# Patient Record
Sex: Male | Born: 1935
Health system: Southern US, Community
[De-identification: ages and names within clinical notes are randomized; demographics above are authoritative.]

## PROBLEM LIST (undated history)

## (undated) DIAGNOSIS — Z86010 Personal history of colonic polyps: Secondary | ICD-10-CM

## (undated) DIAGNOSIS — Z9089 Acquired absence of other organs: Secondary | ICD-10-CM

## (undated) DIAGNOSIS — Z9889 Other specified postprocedural states: Secondary | ICD-10-CM

## (undated) DIAGNOSIS — H269 Unspecified cataract: Secondary | ICD-10-CM

## (undated) HISTORY — DX: Other specified postprocedural states: Z98.890

## (undated) HISTORY — DX: Personal history of colonic polyps: Z86.010

## (undated) HISTORY — DX: Acquired absence of other organs: Z90.89

## (undated) HISTORY — DX: Unspecified cataract: H26.9

## (undated) HISTORY — PX: HERNIA REPAIR: SHX51

## (undated) HISTORY — PX: HEMORRHOID SURGERY: SHX153

---
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
## 1976-06-25 HISTORY — PX: REPAIR KNEE LIGAMENT: SUR1188

## 2005-05-14 ENCOUNTER — Emergency Department (HOSPITAL_COMMUNITY): Admission: EM | Admit: 2005-05-14 | Discharge: 2005-05-14 | Payer: Self-pay | Admitting: Emergency Medicine

## 2006-04-04 ENCOUNTER — Emergency Department (HOSPITAL_COMMUNITY): Admission: EM | Admit: 2006-04-04 | Discharge: 2006-04-04 | Payer: Self-pay | Admitting: Emergency Medicine

## 2006-05-30 ENCOUNTER — Ambulatory Visit (HOSPITAL_COMMUNITY): Admission: RE | Admit: 2006-05-30 | Discharge: 2006-05-30 | Payer: Self-pay | Admitting: Orthopedic Surgery

## 2006-06-25 HISTORY — PX: ROTATOR CUFF REPAIR: SHX139

## 2006-07-09 ENCOUNTER — Ambulatory Visit (HOSPITAL_COMMUNITY): Admission: RE | Admit: 2006-07-09 | Discharge: 2006-07-10 | Payer: Self-pay | Admitting: Orthopedic Surgery

## 2007-06-26 HISTORY — PX: CATARACT EXTRACTION, BILATERAL: SHX1313

## 2008-06-29 IMAGING — CR DG ORBITS FOR FOREIGN BODY
2 series · 2 of 2 positions shown · non-contrast
Comparison: CT 04/04/06.

CLINICAL DATA: Pre-MRI.  Prior metal exposure.
ORBITS ? 2 VIEWS:

[view not recorded (1 of 2)]
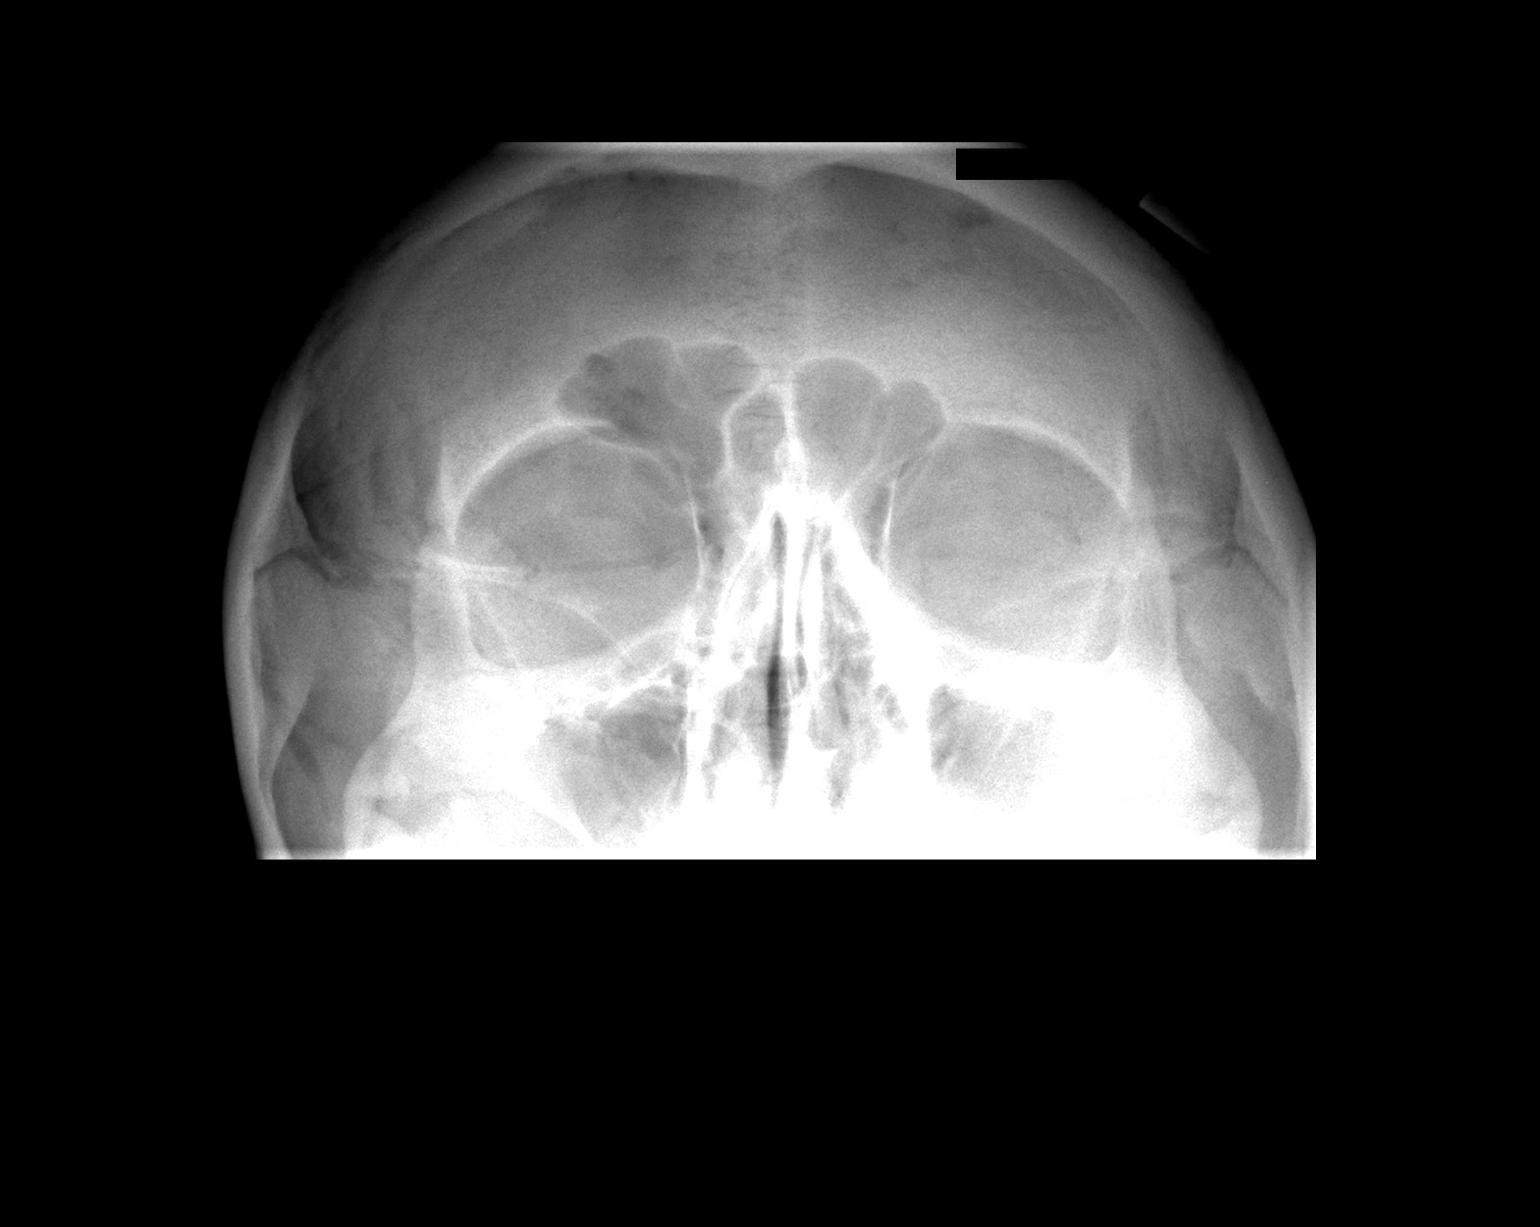

[view not recorded (2 of 2)]
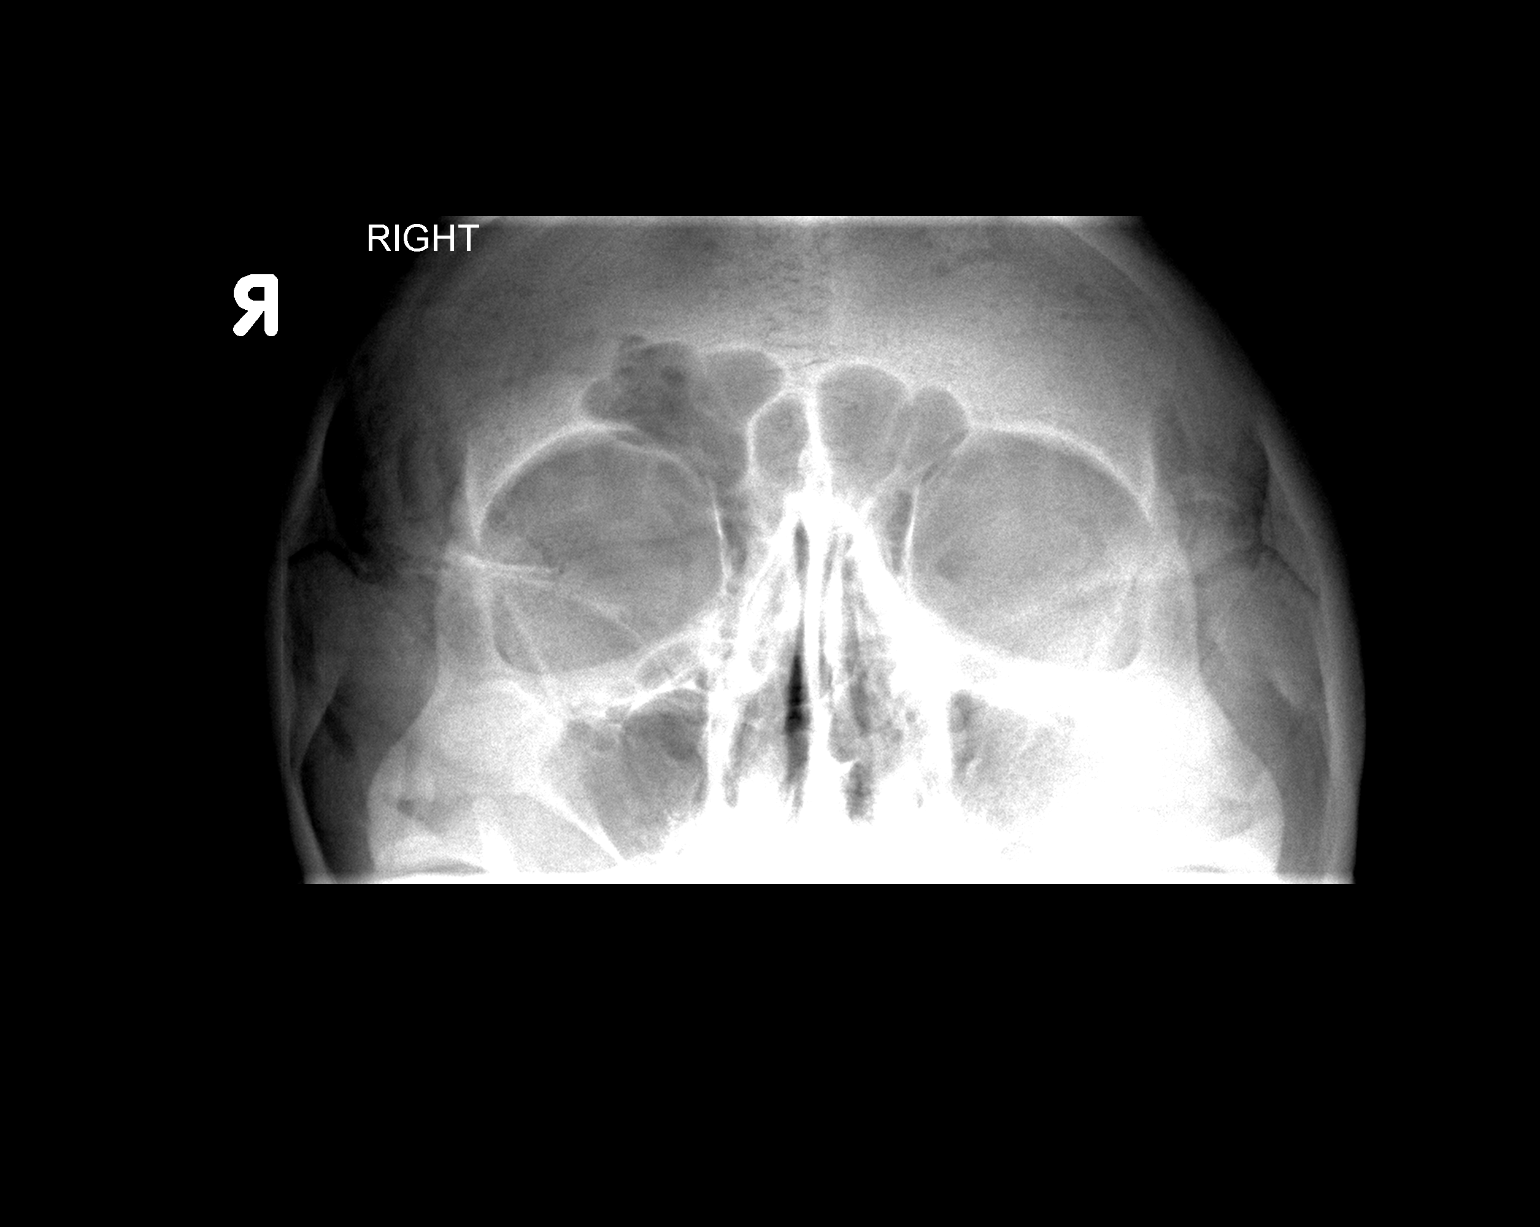

[2 of 2 positions shown; findings below may reference images not displayed]

FINDINGS: No metallic objects of the orbits or globes.
IMPRESSION: Patient is cleared for MRI.

## 2011-06-12 DIAGNOSIS — Z9889 Other specified postprocedural states: Secondary | ICD-10-CM

## 2011-06-12 DIAGNOSIS — Z86010 Personal history of colonic polyps: Secondary | ICD-10-CM

## 2011-06-12 HISTORY — DX: Other specified postprocedural states: Z98.890

## 2011-06-12 HISTORY — DX: Personal history of colonic polyps: Z86.010

## 2012-05-09 ENCOUNTER — Ambulatory Visit (INDEPENDENT_AMBULATORY_CARE_PROVIDER_SITE_OTHER): Payer: Medicare Other | Admitting: Family Medicine

## 2012-05-09 ENCOUNTER — Other Ambulatory Visit: Payer: Self-pay | Admitting: Family Medicine

## 2012-05-09 ENCOUNTER — Ambulatory Visit (INDEPENDENT_AMBULATORY_CARE_PROVIDER_SITE_OTHER): Payer: Medicare Other

## 2012-05-09 ENCOUNTER — Encounter: Payer: Self-pay | Admitting: Family Medicine

## 2012-05-09 VITALS — BP 148/79 | HR 53 | Ht 68.5 in | Wt 166.0 lb

## 2012-05-09 DIAGNOSIS — I10 Essential (primary) hypertension: Secondary | ICD-10-CM | POA: Insufficient documentation

## 2012-05-09 DIAGNOSIS — M7730 Calcaneal spur, unspecified foot: Secondary | ICD-10-CM

## 2012-05-09 DIAGNOSIS — M79609 Pain in unspecified limb: Secondary | ICD-10-CM

## 2012-05-09 DIAGNOSIS — M79671 Pain in right foot: Secondary | ICD-10-CM

## 2012-05-09 DIAGNOSIS — E785 Hyperlipidemia, unspecified: Secondary | ICD-10-CM

## 2012-05-09 DIAGNOSIS — Z125 Encounter for screening for malignant neoplasm of prostate: Secondary | ICD-10-CM

## 2012-05-09 DIAGNOSIS — M2170 Unequal limb length (acquired), unspecified site: Secondary | ICD-10-CM

## 2012-05-09 NOTE — Patient Instructions (Addendum)
Keep follow-up appointment

## 2012-05-09 NOTE — Progress Notes (Signed)
Subjective:    Patient ID: Patrick Perry, male    DOB: Nov 18, 1932, 78 y.o.   MRN: [id number]  HPI He is here to establish care today. He slept in [place] most of his life but has recently moved and this is a closer location for him. He is transferring from [facility]. He is fairly healthy for his age. He does have a history of high blood pressure and takes a half of a tab of hydrochlorothiazide. He also has hyperlipidemia and takes lovastatin. He's been on both medications for quite some time and has not had any problems or side effects. He does take a baby aspirin as well as several supplements them a multivitamin. He denies any chest pain or shortness of breath. He would like to go ahead and get lab work today and come back for physical next week.  He also has some pain and swelling in his right distal foot. He denies any trauma or injury. He says he does notice a few days ago. It's not painful to walk on it unless he walks for a long distance. He is never had significant problems with his feet before.   Review of Systems  Constitutional: Negative for fever, diaphoresis and unexpected weight change.  HENT: Negative for hearing loss, rhinorrhea, sneezing and tinnitus.   Eyes: Negative for visual disturbance.  Respiratory: Negative for cough and wheezing.   Cardiovascular: Negative for chest pain and palpitations.  Gastrointestinal: Negative for nausea, vomiting, diarrhea and blood in stool.  Genitourinary: Negative for dysuria and discharge.  Musculoskeletal: Negative for myalgias and arthralgias.  Skin: Negative for rash.  Neurological: Negative for headaches.  Hematological: Negative for adenopathy.  Psychiatric/Behavioral: Negative for sleep disturbance and dysphoric mood. The patient is not nervous/anxious.    BP 148/79  Pulse 53  Ht 5' 8.5" (1.74 m)  Wt 166 lb (75.297 kg)  BMI 24.87 kg/m2    No Known Allergies  Past Medical History  Diagnosis Date  . Cataract     . History of tonsillectomy 1946  . H/O colonoscopy with polypectomy 06/12/11    repeat in 5 years     Past Surgical History  Procedure Date  . Tonsillectomy 1946  . Hemorroids   . Rotator cuff repair 2008    left  . Repair knee ligament 1978    History   Social History  . Marital Status: Married    Spouse Name: N/A    Number of Children: N/A  . Years of Education: N/A   Occupational History  . Not on file.   Social History Main Topics  . Smoking status: Never Smoker   . Smokeless tobacco: Not on file  . Alcohol Use: No  . Drug Use: No  . Sexually Active: Yes   Other Topics Concern  . Not on file   Social History Narrative  . No narrative on file    Family History  Problem Relation Age of Onset  . Prostate cancer Brother     Outpatient Encounter Prescriptions as of 05/09/2012  Medication Sig Dispense Refill  . aspirin 81 MG tablet Take 81 mg by mouth daily.      Marland Kitchen b complex vitamins capsule Take 1 capsule by mouth daily.      . beta carotene 33295 UNIT capsule Take 10,000 Units by mouth daily.      . cholecalciferol (VITAMIN D) 1000 UNITS tablet Take 1,000 Units by mouth daily.      . Coenzyme Q10 (CO Q-10)  100 MG CAPS Take 1 capsule by mouth daily.      . fish oil-omega-3 fatty acids 1000 MG capsule Take 2 g by mouth daily.      Marland Kitchen glucosamine-chondroitin 500-400 MG tablet Take 1 tablet by mouth daily.      . hydrochlorothiazide (HYDRODIURIL) 25 MG tablet Take 12.5 mg by mouth daily.       Marland Kitchen lovastatin (MEVACOR) 40 MG tablet Take 40 mg by mouth at bedtime.      . Lycopene 10 MG CAPS Take 1 capsule by mouth daily.      . Misc Natural Products (PROSTATE) CAPS Take 1 tablet by mouth 2 (two) times daily.      . vitamin C (ASCORBIC ACID) 500 MG tablet Take 500 mg by mouth daily.      . [DISCONTINUED] vitamin E (VITAMIN E) 400 UNIT capsule Take 400 Units by mouth daily.              Objective:   Physical Exam  Constitutional: He is oriented to person,  place, and time. He appears well-developed and well-nourished.  HENT:  Head: Normocephalic and atraumatic.  Neck: Neck supple. No thyromegaly present.  Cardiovascular: Normal rate, regular rhythm and normal heart sounds.        No carotid bruits.  Pulmonary/Chest: Effort normal and breath sounds normal.  Musculoskeletal:        He does have a lift built into his left shoe. For leg length discrepancy. Right distal foot near the metatarsals, there is some significant swelling. No erythema or induration. He is tender over the distal second metatarsal. No pain between the toes. No bruising.  Lymphadenopathy:    He has no cervical adenopathy.  Neurological: He is alert and oriented to person, place, and time.  Skin: Skin is warm and dry.  Psychiatric: He has a normal mood and affect. His behavior is normal.          Assessment & Plan:  Hypertension-uncontrolled. It is elevated today. We will recheck in one week at his followup appointment. If it's still high at that point consider increasing his HCTZ to a whole tab. Continue baby aspirin daily. Next  Discussed stopping his vitamin E.  And that there some increased risk with taking vitamin E. Is not as healthy for her heart as we thought it was at one time and I encouraged him to stop it.  Hyperlipidemia-due to recheck lipids. Continue lovastatin for now. We'll also check liver enzymes.  Right foot pain-I. did get x-rays to rule out stress fracture. No known trauma. That's negative then keep the foot elevated avoid walking long distances and can use Tylenol as needed for pain relief.

## 2012-05-10 ENCOUNTER — Encounter: Payer: Self-pay | Admitting: Family Medicine

## 2012-05-10 DIAGNOSIS — N400 Enlarged prostate without lower urinary tract symptoms: Secondary | ICD-10-CM | POA: Insufficient documentation

## 2012-05-10 DIAGNOSIS — K624 Stenosis of anus and rectum: Secondary | ICD-10-CM | POA: Insufficient documentation

## 2012-05-10 DIAGNOSIS — S93144A Subluxation of metatarsophalangeal joint of right lesser toe(s), initial encounter: Secondary | ICD-10-CM | POA: Insufficient documentation

## 2012-05-10 LAB — LIPID PANEL
HDL: 53 mg/dL (ref >39)
LDL Cholesterol: 85 mg/dL (ref 0–99)
Total CHOL/HDL Ratio: 3 Ratio

## 2012-05-10 LAB — CBC WITH DIFFERENTIAL/PLATELET
Basophils Absolute: 0 10*3/uL (ref 0.0–0.1)
Basophils Relative: 1 % (ref 0–1)
Eosinophils Relative: 3 % (ref 0–5)
Lymphocytes Relative: 35 % (ref 12–46)
Lymphs Abs: 2.1 10*3/uL (ref 0.7–4.0)
MCV: 92 fL (ref 78.0–100.0)
Monocytes Absolute: 0.8 10*3/uL (ref 0.1–1.0)
Monocytes Relative: 13 % — ABNORMAL HIGH (ref 3–12)
Neutro Abs: 2.9 10*3/uL (ref 1.7–7.7)
Neutrophils Relative %: 48 % (ref 43–77)
Platelets: 240 10*3/uL (ref 150–400)
RBC: 4.98 MIL/uL (ref 4.22–5.81)
RDW: 14.7 % (ref 11.5–15.5)
WBC: 6 10*3/uL (ref 4.0–10.5)

## 2012-05-10 LAB — COMPLETE METABOLIC PANEL WITH GFR
ALT: 8 U/L (ref 0–53)
AST: 27 U/L (ref 0–37)
Albumin: 4.5 g/dL (ref 3.5–5.2)
Alkaline Phosphatase: 57 U/L (ref 39–117)
BUN: 13 mg/dL (ref 6–23)
CO2: 29 mEq/L (ref 19–32)
Calcium: 9.2 mg/dL (ref 8.4–10.5)
Chloride: 103 mEq/L (ref 96–112)
Creat: 0.97 mg/dL (ref 0.50–1.35)
GFR, Est African American: 87 mL/min
GFR, Est Non African American: 75 mL/min
Glucose, Bld: 87 mg/dL (ref 70–99)
Potassium: 4.1 mEq/L (ref 3.5–5.3)
Sodium: 140 mEq/L (ref 135–145)
Total Protein: 7.2 g/dL (ref 6.0–8.3)

## 2012-05-10 LAB — PSA: PSA: 1.92 ng/mL (ref <=4.00)

## 2012-05-10 NOTE — Progress Notes (Signed)
Quick Note:  All labs are normal. ______ 

## 2012-05-12 LAB — URIC ACID: Uric Acid, Serum: 5.4 mg/dL (ref 4.0–7.8)

## 2012-05-16 ENCOUNTER — Encounter: Payer: Self-pay | Admitting: Family Medicine

## 2012-05-16 ENCOUNTER — Ambulatory Visit (INDEPENDENT_AMBULATORY_CARE_PROVIDER_SITE_OTHER): Payer: Medicare Other | Admitting: Family Medicine

## 2012-05-16 VITALS — BP 118/58 | HR 60 | Ht 68.5 in | Wt 166.0 lb

## 2012-05-16 DIAGNOSIS — Z0000 Encounter for general adult medical examination without abnormal findings: Secondary | ICD-10-CM

## 2012-05-16 MED ORDER — LOVASTATIN 40 MG PO TABS: 40.0000 mg | ORAL_TABLET | Freq: Every day | ORAL | Status: DC

## 2012-05-16 MED ORDER — HYDROCHLOROTHIAZIDE 25 MG PO TABS: 12.5000 mg | ORAL_TABLET | Freq: Every day | ORAL | Status: DC

## 2012-05-16 NOTE — Patient Instructions (Addendum)
Please schedule an appointment for your foot with [facility].   A month before you return in 6 months you can go ahead and stop her blood pressure pill. Keep a close eye on the blood pressure numbers and bring in a copy of the inferomedial look over. If everything looks great at that point then we will continue to hold the medication, with close monitoring.

## 2012-05-16 NOTE — Progress Notes (Signed)
Subjective:    Patrick Perry is a 78 y.o. male who presents for Medicare Annual/Subsequent preventive examination.   Preventive Screening-Counseling & Management  Tobacco History  Smoking status  . Never Smoker   Smokeless tobacco  . Not on file    Problems Prior to Visit 1. None  Current Problems (verified) Patient Active Problem List  Diagnosis  . Leg length discrepancy  . Essential hypertension, benign  . Other and unspecified hyperlipidemia  . BPH (benign prostatic hyperplasia)  . Rectal stricture  . Degenerative arthritis    Medications Prior to Visit Current Outpatient Prescriptions on File Prior to Visit  Medication Sig Dispense Refill  . aspirin 81 MG tablet Take 81 mg by mouth daily.      Marland Kitchen b complex vitamins capsule Take 1 capsule by mouth daily.      . beta carotene 84696 UNIT capsule Take 10,000 Units by mouth daily.      . cholecalciferol (VITAMIN D) 1000 UNITS tablet Take 1,000 Units by mouth daily.      . Coenzyme Q10 (CO Q-10) 100 MG CAPS Take 1 capsule by mouth daily.      . fish oil-omega-3 fatty acids 1000 MG capsule Take 2 g by mouth daily.      Marland Kitchen glucosamine-chondroitin 500-400 MG tablet Take 1 tablet by mouth daily.      . hydrochlorothiazide (HYDRODIURIL) 25 MG tablet Take 12.5 mg by mouth daily.       Marland Kitchen lovastatin (MEVACOR) 40 MG tablet Take 40 mg by mouth at bedtime.      . Lycopene 10 MG CAPS Take 1 capsule by mouth daily.      . Misc Natural Products (PROSTATE) CAPS Take 1 tablet by mouth 2 (two) times daily.      . vitamin C (ASCORBIC ACID) 500 MG tablet Take 500 mg by mouth daily.        Current Medications (verified) Current Outpatient Prescriptions  Medication Sig Dispense Refill  . aspirin 81 MG tablet Take 81 mg by mouth daily.      Marland Kitchen b complex vitamins capsule Take 1 capsule by mouth daily.      . beta carotene 29528 UNIT capsule Take 10,000 Units by mouth daily.      . cholecalciferol (VITAMIN D) 1000 UNITS tablet Take 1,000  Units by mouth daily.      . Coenzyme Q10 (CO Q-10) 100 MG CAPS Take 1 capsule by mouth daily.      . fish oil-omega-3 fatty acids 1000 MG capsule Take 2 g by mouth daily.      Marland Kitchen glucosamine-chondroitin 500-400 MG tablet Take 1 tablet by mouth daily.      . hydrochlorothiazide (HYDRODIURIL) 25 MG tablet Take 12.5 mg by mouth daily.       Marland Kitchen lovastatin (MEVACOR) 40 MG tablet Take 40 mg by mouth at bedtime.      . Lycopene 10 MG CAPS Take 1 capsule by mouth daily.      . Misc Natural Products (PROSTATE) CAPS Take 1 tablet by mouth 2 (two) times daily.      . vitamin C (ASCORBIC ACID) 500 MG tablet Take 500 mg by mouth daily.         Allergies (verified) Review of patient's allergies indicates no known allergies.   PAST HISTORY  Family History Family History  Problem Relation Age of Onset  . Prostate cancer Brother     Social History History  Substance Use Topics  . Smoking  status: Never Smoker   . Smokeless tobacco: Not on file  . Alcohol Use: No    Are there smokers in your home (other than you)?  No  Risk Factors Current exercise habits: treadmill 3 x per week  Dietary issues discussed: none   Cardiac risk factors: advanced age (older than 50 for men, 33 for women), hypertension and male gender.  Depression Screen (Note: if answer to either of the following is "Yes", a more complete depression screening is indicated)   Q1: Over the past two weeks, have you felt down, depressed or hopeless? No  Q2: Over the past two weeks, have you felt little interest or pleasure in doing things? No  Have you lost interest or pleasure in daily life? No  Do you often feel hopeless? No  Do you cry easily over simple problems? No  Activities of Daily Living In your present state of health, do you have any difficulty performing the following activities?:  Driving? No Managing money?  No Feeding yourself? No Getting from bed to chair? No Climbing a flight of stairs? No Preparing food  and eating?: No Bathing or showering? No Getting dressed: No Getting to the toilet? No Using the toilet:No Moving around from place to place: No In the past year have you fallen or had a near fall?:No   Are you sexually active?  Yes  Do you have more than one partner?  No  Hearing Difficulties: No Do you often ask people to speak up or repeat themselves? No Do you experience ringing or noises in your ears? No Do you have difficulty understanding soft or whispered voices? Yes   Do you feel that you have a problem with memory? No  Do you often misplace items? No  Do you feel safe at home?  No  Cognitive Testing  Alert? Yes  Normal Appearance?Yes  Oriented to person? Yes  Place? Yes   Time? Yes  Recall of three objects?  Yes  Can perform simple calculations? Yes  Displays appropriate judgment?Yes  Can read the correct time from a watch face?Yes   Advanced Directives have been discussed with the patient? Yes   List the Names of Other Physician/Practitioners you currently use: 1.  Dr. Jackquline Bosch - optimetry 2. Dr. Dan Humphreys - dentist.  3. [facility] - Gastroentroenterology  Indicate any recent Medical Services you may have received from other than [facility] providers in the past year (date may be approximate).  Immunization History  Administered Date(s) Administered  . Influenza Split 03/19/2012  . Pneumococcal Conjugate 04/30/2006  . Tdap 05/01/2010  . Zoster 04/03/2006    Screening Tests Health Maintenance  Topic Date Due  . Influenza Vaccine  02/23/2013  . Tetanus/tdap  05/01/2020  . Colonoscopy  06/11/2021  . Pneumococcal Polysaccharide Vaccine Age 58 And Over  Completed  . Zostavax  Completed    All answers were reviewed with the patient and necessary referrals were made:  Hajira Verhagen, MD   05/16/2012   History reviewed: allergies, current medications, past family history, past medical history, past social history, past surgical history and problem  list  Review of Systems A comprehensive review of systems was negative.    Objective:     Vision by Snellen chart: right eye:20/30, left eye:20/30 Blood pressure 118/58, pulse 60, height 5' 8.5" (1.74 m), weight 166 lb (75.297 kg). Body mass index is 24.87 kg/(m^2).  BP 118/58  Pulse 60  Ht 5' 8.5" (1.74 m)  Wt 166 lb (75.297 kg)  BMI 24.87 kg/m2  General Appearance:    Alert, cooperative, no distress, appears stated age  Head:    Normocephalic, without obvious abnormality, atraumatic  Eyes:    PERRL, conjunctiva/corneas clear, EOM's intact, both eyes       Ears:    Normal TM's and external ear canals, both ears  Nose:   Nares normal, septum midline, mucosa normal, no drainage    or sinus tenderness  Throat:   Lips, mucosa, and tongue normal; teeth and gums normal  Neck:   Supple, symmetrical, trachea midline, no adenopathy;       thyroid:  No enlargement/tenderness/nodules; no carotid   bruit or JVD  Back:     Symmetric, no curvature, ROM normal, no CVA tenderness  Lungs:     Clear to auscultation bilaterally, respirations unlabored  Chest wall:    No tenderness or deformity  Heart:    Regular rate and rhythm, S1 and S2 normal, no murmur, rub   or gallop  Abdomen:     Soft, non-tender, bowel sounds active all four quadrants,    no masses, no organomegaly  Genitalia:    Not performed.   Rectal:    Not perfromed.   Extremities:   Extremities normal, atraumatic, no cyanosis or edema. Distal right foot is still swollen. Tender of 2nd, 3rd MTPs. No redness.    Pulses:   2+ and symmetric all extremities  Skin:   Skin color, texture, turgor normal, no rashes or lesions  Lymph nodes:   Cervical, supraclavicular, and axillary nodes normal  Neurologic:   CNII-XII intact. Normal strength, sensation and reflexes      throughout       Assessment:     Annual Wellness Exam     Plan:     During the course of the visit the patient was educated and counseled about appropriate  screening and preventive services including:    up to date on all meds  Vaccine are all uptodate.  Had eye exam yesterday  Given copy of labwork.   Refer to Dr. Benjamin Stain for his foot. He still has significant swelling and tenderness over the distal MTP, second and third. Uric acid level was normal. X-ray showed arthritis but no occult fracture. He really has not stayed off his foot. He still having significant pain and has been unable to walk on the treadmill and keep up his exercise routine because of it. But he is still been working in the yard.  Diet review for nutrition referral? Yes ____  Not Indicated __x__   Patient Instructions (the written plan) was given to the patient.  Medicare Attestation I have personally reviewed: The patient's medical and social history Their use of alcohol, tobacco or illicit drugs Their current medications and supplements The patient's functional ability including ADLs,fall risks, home safety risks, cognitive, and hearing and visual impairment Diet and physical activities Evidence for depression or mood disorders  The patient's weight, height, BMI, and visual acuity have been recorded in the chart.  I have made referrals, counseling, and provided education to the patient based on review of the above and I have provided the patient with a written personalized care plan for preventive services.     Freya Zobrist, MD   05/16/2012

## 2012-05-20 ENCOUNTER — Encounter: Payer: Self-pay | Admitting: Sports Medicine

## 2012-05-20 ENCOUNTER — Ambulatory Visit (INDEPENDENT_AMBULATORY_CARE_PROVIDER_SITE_OTHER): Payer: Medicare Other

## 2012-05-20 ENCOUNTER — Ambulatory Visit (INDEPENDENT_AMBULATORY_CARE_PROVIDER_SITE_OTHER): Payer: Medicare Other | Admitting: Sports Medicine

## 2012-05-20 VITALS — BP 139/70 | HR 64 | Ht 68.5 in | Wt 171.0 lb

## 2012-05-20 DIAGNOSIS — M25511 Pain in right shoulder: Secondary | ICD-10-CM

## 2012-05-20 DIAGNOSIS — M65979 Unspecified synovitis and tenosynovitis, unspecified ankle and foot: Secondary | ICD-10-CM

## 2012-05-20 DIAGNOSIS — M19019 Primary osteoarthritis, unspecified shoulder: Secondary | ICD-10-CM

## 2012-05-20 DIAGNOSIS — M25519 Pain in unspecified shoulder: Secondary | ICD-10-CM

## 2012-05-20 DIAGNOSIS — M659 Synovitis and tenosynovitis, unspecified: Secondary | ICD-10-CM

## 2012-05-20 MED ORDER — MELOXICAM 15 MG PO TABS: ORAL_TABLET | ORAL | Status: DC

## 2012-05-20 NOTE — Assessment & Plan Note (Signed)
Symptoms are highly classic for rotator cuff dysfunction, likely infraspinatus. We will start conservatively with Mobic, formal physical therapy. I would like to see some x-rays of his right shoulder. We will see him back in 4 weeks, and if unimproved we can consider an ultrasound-guided subacromial bursa injection.

## 2012-05-20 NOTE — Progress Notes (Signed)
SPORTS MEDICINE CONSULTATION REPORT  Subjective:    I'm seeing this patient as a consultation for:  Dr. Linford Arnold  CC: Right foot pain  HPI: Patrick Perry is a very pleasant 78 year old male who's noted about a 4 week history of pain he localizes along the second metatarsophalangeal joint of his right foot. He's also noted swelling. The pain is localized, severe, and does not radiate. It's worse with walking, and worse with palpation. He denies any prior URI symptoms, and denies any constitutional symptoms. He has not yet taken any oral anti-inflammatories other than aspirin.  Right shoulder: He is status post operative rotator cuff repair on the left side, lately he's noted pain that he localizes over the deltoid on the right shoulder, worse with overhead motions, better with rest. Pain is localized, does not radiate, is moderate, and does wake him up at night. He denies any trauma.  Past medical history, Surgical history, Family history, Social history, Allergies, and medications have been entered into the medical record, reviewed, and no changes needed.   Review of Systems: No headache, visual changes, nausea, vomiting, diarrhea, constipation, dizziness, abdominal pain, skin rash, fevers, chills, night sweats, weight loss, swollen lymph nodes, body aches, joint swelling, muscle aches, chest pain, or shortness of breath.   Objective:   Vitals:  Afebrile, vital signs stable. General: Well Developed, well nourished, and in no acute distress.  Neuro/Psych: Alert and oriented x3, extra-ocular muscles intact, able to move all 4 extremities.  Skin: Warm and dry, no rashes noted.  Respiratory: Not using accessory muscles, speaking in full sentences, trachea midline.  Cardiovascular: Pulses palpable, no extremity edema. Abdomen: Does not appear distended. Right Shoulder: Inspection reveals no abnormalities, atrophy or asymmetry. Palpation is normal with no tenderness over AC joint or bicipital  groove. ROM is full in all planes. Rotator cuff strength weak external rotation and abduction. Positive empty can sign, negative Neer and negative Hawkins tests. Speeds and Yergason's tests normal. No labral pathology noted with negative Obrien's, negative clunk and good stability. Normal scapular function observed. No painful arc and no drop arm sign. No apprehension sign  Right foot: There is some fullness over the second metatarsophalangeal joint. He also has some fullness as well as prominent osteophytes over the first metatarsophalangeal joint. There is exquisite tenderness over the plantar and dorsal aspects of his second metatarsophalangeal joint with limitation in toe mobility. He is neurovascularly intact distally.  I did review his x-rays of his right foot, there is moderate degenerative change with osteophytes of the first metatarsophalangeal joint. His second metatarsophalangeal joint is overall unremarkable radiographically.  Procedure: Real-time Ultrasound Guided Injection of right second metatarsophalangeal joint. Device: GE Logiq E  Ultrasound guided injection is preferred based studies that show increased duration, increased effect, greater accuracy, decreased procedural pain, increased response rate, and decreased cost with ultrasound guided versus blind injection.  Verbal informed consent obtained.  Time-out conducted.  Noted no overlying erythema, induration, or other signs of local infection.  Skin prepped in a sterile fashion.  Local anesthesia: Topical Ethyl chloride.  With sterile technique and under real time ultrasound guidance:  1 cc Kenalog 40, 1 cc lidocaine injected easily into the joint. Completed without difficulty  Pain immediately resolved suggesting accurate placement of the medication.  Advised to call if fevers/chills, erythema, induration, drainage, or persistent bleeding.  Images permanently stored and available for review in the ultrasound unit.   Impression: Technically successful ultrasound guided injection.  I placed a metatarsal pad just behind the  metatarsal heads in his right shoe.  Impression and Recommendations:   This case required medical decision making of moderate complexity.

## 2012-05-20 NOTE — Addendum Note (Signed)
Addended by: Monica Becton on: 05/20/2012 05:22 PM   Modules accepted: Level of Service

## 2012-05-20 NOTE — Assessment & Plan Note (Signed)
Failed conservative therapy, and symptoms have been present for 3-4 weeks. Ultrasound guided injection as above, metatarsal pad placed as he did have small element of metatarsalgia. He will come back to see me in 4 weeks.

## 2012-05-29 ENCOUNTER — Ambulatory Visit: Payer: Medicare Other | Attending: Sports Medicine | Admitting: Physical Therapy

## 2012-05-29 DIAGNOSIS — M6281 Muscle weakness (generalized): Secondary | ICD-10-CM | POA: Insufficient documentation

## 2012-05-29 DIAGNOSIS — IMO0001 Reserved for inherently not codable concepts without codable children: Secondary | ICD-10-CM | POA: Insufficient documentation

## 2012-05-29 DIAGNOSIS — M25519 Pain in unspecified shoulder: Secondary | ICD-10-CM | POA: Insufficient documentation

## 2012-06-12 ENCOUNTER — Ambulatory Visit: Payer: Medicare Other | Admitting: Physical Therapy

## 2012-06-20 ENCOUNTER — Ambulatory Visit (INDEPENDENT_AMBULATORY_CARE_PROVIDER_SITE_OTHER): Payer: Medicare Other | Admitting: Sports Medicine

## 2012-06-20 ENCOUNTER — Encounter: Payer: Self-pay | Admitting: Sports Medicine

## 2012-06-20 VITALS — BP 132/74 | HR 56 | Wt 174.0 lb

## 2012-06-20 DIAGNOSIS — M25519 Pain in unspecified shoulder: Secondary | ICD-10-CM

## 2012-06-20 DIAGNOSIS — M65979 Unspecified synovitis and tenosynovitis, unspecified ankle and foot: Secondary | ICD-10-CM

## 2012-06-20 DIAGNOSIS — M25511 Pain in right shoulder: Secondary | ICD-10-CM

## 2012-06-20 DIAGNOSIS — M659 Synovitis and tenosynovitis, unspecified: Secondary | ICD-10-CM

## 2012-06-20 NOTE — Assessment & Plan Note (Signed)
Symptoms completely resolved after a single intra-articular injection. Some bruising was present. He does have some subluxation of his great toe underneath the second toe. We wrapped this with Coban, and I showed him some over-the-counter toe separated that can be ordered from [facility] and used to correct this.

## 2012-06-20 NOTE — Assessment & Plan Note (Signed)
Pain completely resolved after physical therapy and anti-inflammatories. We can defer injection until symptoms return.

## 2012-06-20 NOTE — Progress Notes (Signed)
SPORTS MEDICINE CONSULTATION REPORT  Subjective:    CC: Followup   HPI: Right second metatarsophalangeal joint synovitis: Symptoms resolved 100% after a single intra-articular injection, he did have a little bit of bruising. He also noted significant improvement in his symptoms with placement of a metatarsal pad.  Shoulder pain: Diagnosed with rotator cuff dysfunction, formal physical therapy, and Mobic, symptoms are resolved.  Past medical history, Surgical history, Family history, Social history, Allergies, and medications have been entered into the medical record, reviewed, and no changes needed.   Review of Systems: No headache, visual changes, nausea, vomiting, diarrhea, constipation, dizziness, abdominal pain, skin rash, fevers, chills, night sweats, weight loss, swollen lymph nodes, body aches, joint swelling, muscle aches, chest pain, shortness of breath, mood changes, visual or auditory hallucinations.   Objective:   Vitals:  Afebrile, vital signs stable. General: Well Developed, well nourished, and in no acute distress.  Neuro/Psych: Alert and oriented x3, extra-ocular muscles intact, able to move all 4 extremities.  Skin: Warm and dry, no rashes noted.  Respiratory: Not using accessory muscles, speaking in full sentences, trachea midline.  Cardiovascular: Pulses palpable, no extremity edema. Abdomen: Does not appear distended. Right foot: There is subluxation of the second toe over the top of his first toe with hallux valgus. There is some bruising present, but there is no tenderness. Right shoulder: Inspection reveals no abnormalities, atrophy or asymmetry. Palpation is normal with no tenderness over AC joint or bicipital groove. ROM is full in all planes. Rotator cuff strength normal throughout. No signs of impingement with negative Neer and Hawkin's tests, empty can sign. Speeds and Yergason's tests normal. No labral pathology noted with negative Obrien's, negative clunk  and good stability. Normal scapular function observed. No painful arc and no drop arm sign. No apprehension sign  Impression and Recommendations:   This case required medical decision making of moderate complexity.

## 2012-07-29 ENCOUNTER — Telehealth: Payer: Self-pay | Admitting: *Deleted

## 2012-09-09 ENCOUNTER — Ambulatory Visit (INDEPENDENT_AMBULATORY_CARE_PROVIDER_SITE_OTHER): Payer: Medicare Other | Admitting: Sports Medicine

## 2012-09-09 ENCOUNTER — Encounter: Payer: Self-pay | Admitting: Sports Medicine

## 2012-09-09 VITALS — BP 127/74 | HR 56 | Wt 174.0 lb

## 2012-09-09 DIAGNOSIS — M659 Synovitis and tenosynovitis, unspecified: Secondary | ICD-10-CM

## 2012-09-09 DIAGNOSIS — M65979 Unspecified synovitis and tenosynovitis, unspecified ankle and foot: Secondary | ICD-10-CM

## 2012-09-09 DIAGNOSIS — K4090 Unilateral inguinal hernia, without obstruction or gangrene, not specified as recurrent: Secondary | ICD-10-CM | POA: Insufficient documentation

## 2012-09-09 NOTE — Assessment & Plan Note (Signed)
Pain is resolved since guided injection of her 3 months ago. He does have some dorsal subluxation of the metatarsophalangeal joint, second. This can be reduced easily, and with correction of his fall and transverse arch this does not re\re dislocate. I would like him to come in for custom orthotics, and I would likely add a large metatarsal pad. He should continue to wear his over the counter toe separator's.

## 2012-09-09 NOTE — Progress Notes (Signed)
  Subjective:    CC: Followup  HPI: Right second metatarsophalangeal joint synovitis: Pain is completely resolved since injection 3 months ago, he does have some subluxation of the metatarsophalangeal joint. This is improved with the toe separator that he purchased on line. He does wear orthotics, three quarters length with an insole over the top is moderately comfortable.  Inguinal hernia: Recalls lifting something heavy recently, And now has a protrusion that he localizes around the linea semilunaris on the right side of his lower abdomen. Denies any abdominal pain, constipation or obstipation, nausea or vomiting.  Past medical history, Surgical history, Family history not pertinant except as noted below, Social history, Allergies, and medications have been entered into the medical record, reviewed, and no changes needed.   Review of Systems: No headache, visual changes, nausea, vomiting, diarrhea, constipation, dizziness, abdominal pain, skin rash, fevers, chills, night sweats, weight loss, swollen lymph nodes, body aches, joint swelling, muscle aches, chest pain, shortness of breath, mood changes, visual or auditory hallucinations.   Objective:   General: Well Developed, well nourished, and in no acute distress.  Neuro/Psych: Alert and oriented x3, extra-ocular muscles intact, able to move all 4 extremities, sensation grossly intact. Skin: Warm and dry, no rashes noted.  Respiratory: Not using accessory muscles, speaking in full sentences, trachea midline.  Cardiovascular: Pulses palpable, no extremity edema. Abdomen: Does not appear distended. There is a palpable fascial defect with a hernia medial to the linear semilunaris. There is no tenderness, no organomegaly. No rigidity and no guarding. Right second metatarsophalangeal joint subluxes dorsally, this is easily reducible. With palpable and manual reproduction of the transverse arch the toe no longer subluxes.  Impression and  Recommendations:   This case required medical decision making of moderate complexity.

## 2012-09-09 NOTE — Assessment & Plan Note (Signed)
No signs or symptoms of incarceration or strangulation. 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

## 2012-09-09 NOTE — Patient Instructions (Addendum)

## 2012-09-10 ENCOUNTER — Ambulatory Visit (INDEPENDENT_AMBULATORY_CARE_PROVIDER_SITE_OTHER): Payer: Medicare Other | Admitting: Sports Medicine

## 2012-09-10 DIAGNOSIS — S93129A Dislocation of metatarsophalangeal joint of unspecified toe(s), initial encounter: Secondary | ICD-10-CM

## 2012-09-10 DIAGNOSIS — S93144A Subluxation of metatarsophalangeal joint of right lesser toe(s), initial encounter: Secondary | ICD-10-CM

## 2012-09-10 NOTE — Progress Notes (Signed)
  Subjective:    CC: Followup  HPI: Right second metatarsophalangeal subluxation: This is been fairly chronic, we've tried to treat it with bracing, taping. More recently we decided to try custom orthotics with large metatarsal pads. He was also having significant foot pain diffusely.  Past medical history, Surgical history, Family history not pertinant except as noted below, Social history, Allergies, and medications have been entered into the medical record, reviewed, and no changes needed.   Review of Systems: No headache, visual changes, nausea, vomiting, diarrhea, constipation, dizziness, abdominal pain, skin rash, fevers, chills, night sweats, weight loss, swollen lymph nodes, body aches, joint swelling, muscle aches, chest pain, shortness of breath, mood changes, visual or auditory hallucinations.   Objective:   General: Well Developed, well nourished, and in no acute distress.  Neuro/Psych: Alert and oriented x3, extra-ocular muscles intact, able to move all 4 extremities, sensation grossly intact. Skin: Warm and dry, no rashes noted.  Respiratory: Not using accessory muscles, speaking in full sentences, trachea midline.  Cardiovascular: Pulses palpable, no extremity edema. Abdomen: Does not appear distended. Right second metatarsophalangeal joint is unstable and continues to sublux dorsally. I strapped this down which prevented further subluxation.  Patient was fitted for a : standard, cushioned, semi-rigid orthotic. The orthotic was heated and afterward the patient stood on the orthotic blank positioned on the orthotic stand. The patient was positioned in subtalar neutral position and 10 degrees of ankle dorsiflexion in a weight bearing stance. After completion of molding, a stable base was applied to the orthotic blank. The blank was ground to a stable position for weight bearing. Size: 12 Base: Blue EVA Additional Posting and Padding: Large metatarsal pad placed under right  orthotic. The patient ambulated these, and they were very comfortable.  I spent 40 minutes with this patient, greater than 50% was face-to-face time counseling regarding the below diagnosis. Impression and Recommendations:   This case required medical decision making of moderate complexity.

## 2012-09-10 NOTE — Assessment & Plan Note (Addendum)
Custom orthotics as above. I did place a large metatarsal pad just behind the second metatarsal head. I also strapped the toe. This helped reduce the subluxation and keep it reduced. Unfortunately there is persistent chronic subluxation dorsally of the second proximal phalanx over the metatarsal. I would like Patrick Perry to see Patrick Perry with foot and ankle surgery for an opinion on fusion.

## 2012-09-16 ENCOUNTER — Encounter (INDEPENDENT_AMBULATORY_CARE_PROVIDER_SITE_OTHER): Payer: Self-pay | Admitting: Surgery

## 2012-09-16 ENCOUNTER — Ambulatory Visit (INDEPENDENT_AMBULATORY_CARE_PROVIDER_SITE_OTHER): Payer: Medicare Other | Admitting: Surgery

## 2012-09-16 VITALS — BP 128/86 | HR 76 | Temp 98.6°F | Resp 14 | Ht 69.0 in | Wt 173.4 lb

## 2012-09-16 DIAGNOSIS — K4020 Bilateral inguinal hernia, without obstruction or gangrene, not specified as recurrent: Secondary | ICD-10-CM | POA: Insufficient documentation

## 2012-09-16 NOTE — Progress Notes (Signed)
Patient ID: Patrick Perry, male   DOB: May 30, 1933, 79 y.o.   MRN: [id number]  Chief Complaint  Patient presents with  . Other    possible hernia    HPI Patrick Perry is a 79 y.o. male.   HPI This is a very pleasant gentleman referred by Dr. Velva Harman for evaluation of an inguinal hernia.  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 He made noticed a small bulge. The pain has now subsided and he is pain-free. He described this as an ache. It was mild. He has had no nausea, vomiting, or obstructive symptoms. Past Medical History  Diagnosis Date  . Cataract   . History of tonsillectomy 1946  . H/O colonoscopy with polypectomy 06/12/11    repeat in 5 years     Past Surgical History  Procedure Laterality Date  . Tonsillectomy  1946  . Hemorrhoid surgery    . Rotator cuff repair  2008    left  . Repair knee ligament  1978  . Cataract extraction, bilateral  2009    Family History  Problem Relation Age of Onset  . Prostate cancer Brother     Social History History  Substance Use Topics  . Smoking status: Never Smoker   . Smokeless tobacco: Not on file  . Alcohol Use: No    No Known Allergies  Current Outpatient Prescriptions  Medication Sig Dispense Refill  . aspirin 81 MG tablet Take 81 mg by mouth daily.      Marland Kitchen b complex vitamins capsule Take 1 capsule by mouth daily.      . beta carotene 78295 UNIT capsule Take 10,000 Units by mouth daily.      . cholecalciferol (VITAMIN D) 1000 UNITS tablet Take 1,000 Units by mouth daily.      . Coenzyme Q10 (CO Q-10) 100 MG CAPS Take 1 capsule by mouth daily.      . fish oil-omega-3 fatty acids 1000 MG capsule Take 2 g by mouth daily.      Marland Kitchen glucosamine-chondroitin 500-400 MG tablet Take 1 tablet by mouth daily.      . hydrochlorothiazide (HYDRODIURIL) 25 MG tablet Take 0.5 tablets (12.5 mg total) by mouth daily.  90 tablet  1  . lovastatin (MEVACOR) 40 MG  tablet Take 1 tablet (40 mg total) by mouth at bedtime.  90 tablet  1  . Lycopene 10 MG CAPS Take 1 capsule by mouth daily.      . meloxicam (MOBIC) 15 MG tablet One tab PO qAM with breakfast for 2 weeks, then daily prn pain.  30 tablet  3  . Misc Natural Products (PROSTATE) CAPS Take 1 tablet by mouth 2 (two) times daily.      . vitamin C (ASCORBIC ACID) 500 MG tablet Take 500 mg by mouth daily.       No current facility-administered medications for this visit.    Review of Systems Review of Systems  Constitutional: Negative for fever, chills and unexpected weight change.  HENT: Negative for hearing loss, congestion, sore throat, trouble swallowing and voice change.   Eyes: Negative for visual disturbance.  Respiratory: Negative for cough and wheezing.   Cardiovascular: Negative for chest pain, palpitations and leg swelling.  Gastrointestinal: Negative for nausea, vomiting, abdominal pain, diarrhea, constipation, blood in stool, abdominal distention, anal bleeding and rectal pain.  Genitourinary: Negative for hematuria and difficulty urinating.  Musculoskeletal: Negative for arthralgias.  Skin:  Negative for rash and wound.  Neurological: Negative for seizures, syncope, weakness and headaches.  Hematological: Negative for adenopathy. Does not bruise/bleed easily.  Psychiatric/Behavioral: Negative for confusion.    Blood pressure 128/86, pulse 76, temperature 98.6 F (37 C), temperature source Temporal, resp. rate 14, height 5\' 9"  (1.753 m), weight 173 lb 6.4 oz (78.654 kg).  Physical Exam Physical Exam  Constitutional: He is oriented to person, place, and time. He appears well-developed and well-nourished. No distress.  HENT:  Head: Normocephalic and atraumatic.  Right Ear: External ear normal.  Left Ear: External ear normal.  Nose: Nose normal.  Mouth/Throat: Oropharynx is clear and moist.  Eyes: Conjunctivae are normal. Pupils are equal, round, and reactive to light. Right eye  exhibits no discharge. Left eye exhibits no discharge. No scleral icterus.  Neck: Normal range of motion. Neck supple. No tracheal deviation present. No thyromegaly present.  Cardiovascular: Normal rate, regular rhythm, normal heart sounds and intact distal pulses.   No murmur heard. Pulmonary/Chest: Effort normal and breath sounds normal. No respiratory distress. He has no wheezes.  Abdominal: Soft. Bowel sounds are normal. He exhibits no distension. There is no tenderness. There is no rebound and no guarding.  Small, easily reducible bilateral inguinal hernias  Musculoskeletal: Normal range of motion. He exhibits no edema and no tenderness.  Lymphadenopathy:    He has no cervical adenopathy.  Neurological: He is alert and oriented to person, place, and time.  Skin: Skin is warm and dry. No rash noted. He is not diaphoretic. No erythema.  Psychiatric: His behavior is normal. Judgment normal.    Data Reviewed   Assessment    Bilateral inguinal hernias     Plan    I discussed the diagnosis with him in detail. I also discussed surgical repair. I discussed with the open and laparoscopic techniques with mesh. Currently, he is asymptomatic. I discussed conservative management with watchful waiting. He elected to just watch this for now which I feel is very reasonable. I discussed the risk of incarceration as well as signs and symptoms. I told him to go ahead and continue his normal activity. I will see him back in 3 months to see how he is doing. He will come back sooner if he develops discomfort       Maronda Caison A 09/16/2012, 9:22 AM

## 2012-10-07 HISTORY — PX: BUNIONECTOMY: SHX129

## 2012-10-08 ENCOUNTER — Ambulatory Visit: Payer: Medicare Other | Admitting: Sports Medicine

## 2012-11-05 ENCOUNTER — Ambulatory Visit (INDEPENDENT_AMBULATORY_CARE_PROVIDER_SITE_OTHER): Payer: Medicare Other | Admitting: Family Medicine

## 2012-11-05 ENCOUNTER — Encounter: Payer: Self-pay | Admitting: Family Medicine

## 2012-11-05 VITALS — BP 138/80 | HR 58 | Temp 97.8°F | Wt 164.0 lb

## 2012-11-05 DIAGNOSIS — T50905A Adverse effect of unspecified drugs, medicaments and biological substances, initial encounter: Secondary | ICD-10-CM

## 2012-11-05 DIAGNOSIS — T887XXA Unspecified adverse effect of drug or medicament, initial encounter: Secondary | ICD-10-CM

## 2012-11-05 MED ORDER — PREDNISONE 20 MG PO TABS: ORAL_TABLET | ORAL | Status: AC

## 2012-11-05 NOTE — Progress Notes (Signed)
CC: Patrick Perry is a 79 y.o. male is here for Rash   Subjective: HPI:  Patient complains of rash. This began abruptly yesterday. Has been getting worse since onset. Involving entire body but sparing knees down and wrist distally. Described as irritating and itchy. Moderate severity. Worse with scratching. Improves with Benadryl. Nothing else makes better or worse. Has never had this before. Present all hours of the day. Recently changed soaps and Saturday finished a seven-day course of clindamycin for a skin infection, he has never been on clindamycin before. Denies fevers, chills, shortness of breath, angioedema, dysphagia, wheezing, cough, chest pain, abdominal pain, diarrhea, nausea, flushing, rapid heartbeat    Review Of Systems Outlined In HPI  Past Medical History  Diagnosis Date  . Cataract   . History of tonsillectomy 1946  . H/O colonoscopy with polypectomy 06/12/11    repeat in 5 years      Family History  Problem Relation Age of Onset  . Prostate cancer Brother      History  Substance Use Topics  . Smoking status: Never Smoker   . Smokeless tobacco: Not on file  . Alcohol Use: No     Objective: Filed Vitals:   11/05/12 1526  BP: 138/80  Pulse: 58  Temp: 97.8 F (36.6 C)    General: Alert and Oriented, No Acute Distress HEENT: Pupils equal, round, reactive to light. Conjunctivae clear.  External ears unremarkable, canals clear with intact TMs with appropriate landmarks.  Middle ear appears open without effusion. Pink inferior turbinates.  Moist mucous membranes, pharynx without inflammation nor lesions.  Neck supple without palpable lymphadenopathy nor abnormal masses. Lungs: Clear to auscultation bilaterally, no wheezing/ronchi/rales.  Comfortable work of breathing. Good air movement. Cardiac: Regular rate and rhythm. Normal S1/S2.  No murmurs, rubs, nor gallops.   Abdomen: Soft nontender Extremities: No peripheral edema.  Strong peripheral pulses.   Mental Status: No depression, anxiety, nor agitation. Skin: Maculopapular eruption rose-colored involving trunk neck and proximal 4 extremities.  Assessment & Plan: [name] was seen today for rash.  Diagnoses and associated orders for this visit:  Drug reaction, initial encounter - predniSONE (DELTASONE) 20 MG tablet; Three tabs daily days 1-3, two tabs daily days 4-6, one tab daily days 7-9, half tab daily days 10-13.    Discussed with patient my suspicion of drug reaction/allergy or type 4  hypersensitivity due to new soap. Regardless, return to prior soap regimen, no longer taking clindamycin, start prednisone taper with Benadryl as needed and ranitidine 150 mg twice a day as needed. Low suspicion of viral exanthem given lack of additional symptoms.Signs and symptoms requring emergent/urgent reevaluation were discussed with the patient.  Return if symptoms worsen or fail to improve.

## 2012-11-07 ENCOUNTER — Telehealth: Payer: Self-pay | Admitting: *Deleted

## 2012-11-07 NOTE — Telephone Encounter (Signed)
Pt called & wanted you to know that the prednisone seems to be working.  FYI

## 2012-11-25 ENCOUNTER — Other Ambulatory Visit: Payer: Self-pay | Admitting: Family Medicine

## 2012-11-28 ENCOUNTER — Ambulatory Visit (INDEPENDENT_AMBULATORY_CARE_PROVIDER_SITE_OTHER): Payer: Medicare Other | Admitting: Surgery

## 2012-11-28 ENCOUNTER — Encounter (INDEPENDENT_AMBULATORY_CARE_PROVIDER_SITE_OTHER): Payer: Self-pay | Admitting: Surgery

## 2012-11-28 VITALS — BP 118/72 | HR 70 | Temp 97.4°F | Resp 16 | Ht 69.0 in | Wt 173.8 lb

## 2012-11-28 DIAGNOSIS — K4020 Bilateral inguinal hernia, without obstruction or gangrene, not specified as recurrent: Secondary | ICD-10-CM

## 2012-11-28 NOTE — Progress Notes (Signed)
Subjective:     Patient ID: Patrick Perry, male   DOB: Mar 18, 1933, 79 y.o.   MRN: [id number]  HPI He is here for followup of his bilateral inguinal hernias. He remains totally asymptomatic and has not noticed any bulge or any discomfort  Review of Systems     Objective:   Physical Exam On exam, he has very small easily reducible bilateral inguinal hernias which are nontender    Assessment:     Bilateral inguinal hernias     Plan:     Again I explained the very low chance of incarceration. I will see him as needed unless he starts developing symptoms from his hernias and then we will readdress surgical repair

## 2013-03-02 ENCOUNTER — Encounter: Payer: Self-pay | Admitting: Family Medicine

## 2013-03-02 ENCOUNTER — Ambulatory Visit (INDEPENDENT_AMBULATORY_CARE_PROVIDER_SITE_OTHER): Payer: Medicare Other | Admitting: Family Medicine

## 2013-03-02 VITALS — BP 112/65 | HR 57 | Temp 97.7°F | Wt 175.0 lb

## 2013-03-02 DIAGNOSIS — J209 Acute bronchitis, unspecified: Secondary | ICD-10-CM

## 2013-03-02 NOTE — Patient Instructions (Signed)

## 2013-03-02 NOTE — Progress Notes (Signed)
  Subjective:    Patient ID: Patrick Perry, male    DOB: 1932/12/26, 79 y.o.   MRN: [id number]  HPI Dry, hacking cough x 2 weeks w/ occ yellow productive cough. Using Tussin for sleep.  No fever.  No ST.  No GI sxs. Has had some chest tightness. No post nasal drip. No sinus pain or pressure. No fever, chills.  No swollen glands. Not sure if sick contacts.  He has never been a smoker. Denies any shortness of breath or wheezing.  Review of Systems     Objective:   Physical Exam  Constitutional: He is oriented to person, place, and time. He appears well-developed and well-nourished.  HENT:  Head: Normocephalic and atraumatic.  Right Ear: External ear normal.  Left Ear: External ear normal.  Nose: Nose normal.  Mouth/Throat: Oropharynx is clear and moist.  TMs and canals are clear.   Eyes: Conjunctivae and EOM are normal. Pupils are equal, round, and reactive to light.  Neck: Neck supple. No thyromegaly present.  Cardiovascular: Normal rate and normal heart sounds.   Pulmonary/Chest: Effort normal and breath sounds normal.  Lymphadenopathy:    He has no cervical adenopathy.  Neurological: He is alert and oriented to person, place, and time.  Skin: Skin is warm and dry.  Psychiatric: He has a normal mood and affect.          Assessment & Plan:  Acute bronchitis - most likely viral. His lung exam is completely normal today. His oxygen level is normal and reassuring. And temperature is normal. He's been afebrile. He has not had any shortness of breath or wheezing. Okay to continue nighttime cough syrup encouraged him not to take it during the daytime as I want him to cough and move any mucus out of his chest. If he suddenly wakes up and feels worse asked him to call the office to let us know immediately. I offered to get a chest x-ray today or gets up into the week especially with all reassuring signs today. If he is not better by the end of the week he can call the office and we'll get  a chest x-ray at that point in time. He has no prior history of COPD.

## 2013-05-01 ENCOUNTER — Other Ambulatory Visit: Payer: Self-pay | Admitting: Family Medicine

## 2013-05-18 ENCOUNTER — Ambulatory Visit (INDEPENDENT_AMBULATORY_CARE_PROVIDER_SITE_OTHER): Payer: Medicare Other | Admitting: Family Medicine

## 2013-05-18 ENCOUNTER — Encounter: Payer: Self-pay | Admitting: Family Medicine

## 2013-05-18 VITALS — BP 126/63 | HR 53 | Temp 97.7°F | Ht 69.6 in | Wt 168.0 lb

## 2013-05-18 DIAGNOSIS — I10 Essential (primary) hypertension: Secondary | ICD-10-CM

## 2013-05-18 DIAGNOSIS — Z125 Encounter for screening for malignant neoplasm of prostate: Secondary | ICD-10-CM

## 2013-05-18 DIAGNOSIS — Z0000 Encounter for general adult medical examination without abnormal findings: Secondary | ICD-10-CM

## 2013-05-18 LAB — COMPLETE METABOLIC PANEL WITH GFR
AST: 28 U/L (ref 0–37)
Albumin: 4.3 g/dL (ref 3.5–5.2)
Alkaline Phosphatase: 61 U/L (ref 39–117)
BUN: 18 mg/dL (ref 6–23)
GFR, Est Non African American: 76 mL/min
Potassium: 4.4 mEq/L (ref 3.5–5.3)
Total Bilirubin: 0.5 mg/dL (ref 0.3–1.2)

## 2013-05-18 LAB — LIPID PANEL
Cholesterol: 151 mg/dL (ref 0–200)
HDL: 56 mg/dL (ref >39)
Total CHOL/HDL Ratio: 2.7 Ratio
Triglycerides: 90 mg/dL (ref <150)
VLDL: 18 mg/dL (ref 0–40)

## 2013-05-18 MED ORDER — HYDROCHLOROTHIAZIDE 25 MG PO TABS: ORAL_TABLET | ORAL | Status: DC

## 2013-05-18 MED ORDER — LOVASTATIN 40 MG PO TABS: ORAL_TABLET | ORAL | Status: DC

## 2013-05-18 NOTE — Progress Notes (Signed)
Subjective:    Patrick Perry is a 79 y.o. male who presents for Medicare Annual/Subsequent preventive examination.   Preventive Screening-Counseling & Management  Tobacco History  Smoking status  . Never Smoker   Smokeless tobacco  . Not on file    Problems Prior to Visit 1.   Current Problems (verified) Patient Active Problem List   Diagnosis Date Noted  . Bilateral inguinal hernia 09/16/2012  . Inguinal hernia 09/09/2012  . BPH (benign prostatic hyperplasia) 05/10/2012  . Rectal stricture 05/10/2012  . Subluxation of metatarsophalangeal joint of lesser toe of right foot 05/10/2012  . Leg length discrepancy 05/09/2012  . Essential hypertension, benign 05/09/2012  . Other and unspecified hyperlipidemia 05/09/2012    Medications Prior to Visit Current Outpatient Prescriptions on File Prior to Visit  Medication Sig Dispense Refill  . aspirin 81 MG tablet Take 81 mg by mouth daily.      Marland Kitchen b complex vitamins capsule Take 1 capsule by mouth daily.      . cholecalciferol (VITAMIN D) 1000 UNITS tablet Take 1,000 Units by mouth daily.      . Coenzyme Q10 (CO Q-10) 100 MG CAPS Take 1 capsule by mouth daily.      . fish oil-omega-3 fatty acids 1000 MG capsule Take 2 g by mouth daily.      Marland Kitchen glucosamine-chondroitin 500-400 MG tablet Take 1 tablet by mouth daily.      . hydrochlorothiazide (HYDRODIURIL) 25 MG tablet take 1/2 tablets by mouth once daily  90 tablet  0  . lovastatin (MEVACOR) 40 MG tablet take 1 tablet by mouth at bedtime  90 tablet  1  . Misc Natural Products (PROSTATE) CAPS Take 1 tablet by mouth 2 (two) times daily.      . vitamin C (ASCORBIC ACID) 500 MG tablet Take 500 mg by mouth daily.       No current facility-administered medications on file prior to visit.    Current Medications (verified) Current Outpatient Prescriptions  Medication Sig Dispense Refill  . aspirin 81 MG tablet Take 81 mg by mouth daily.      Marland Kitchen b complex vitamins capsule Take 1  capsule by mouth daily.      . cholecalciferol (VITAMIN D) 1000 UNITS tablet Take 1,000 Units by mouth daily.      . Coenzyme Q10 (CO Q-10) 100 MG CAPS Take 1 capsule by mouth daily.      . fish oil-omega-3 fatty acids 1000 MG capsule Take 2 g by mouth daily.      Marland Kitchen glucosamine-chondroitin 500-400 MG tablet Take 1 tablet by mouth daily.      . hydrochlorothiazide (HYDRODIURIL) 25 MG tablet take 1/2 tablets by mouth once daily  90 tablet  0  . lovastatin (MEVACOR) 40 MG tablet take 1 tablet by mouth at bedtime  90 tablet  1  . Misc Natural Products (PROSTATE) CAPS Take 1 tablet by mouth 2 (two) times daily.      . vitamin C (ASCORBIC ACID) 500 MG tablet Take 500 mg by mouth daily.       No current facility-administered medications for this visit.     Allergies (verified) Clindamycin/lincomycin   PAST HISTORY  Family History Family History  Problem Relation Age of Onset  . Prostate cancer Brother     Social History History  Substance Use Topics  . Smoking status: Never Smoker   . Smokeless tobacco: Not on file  . Alcohol Use: No    Are there  smokers in your home (other than you)?  No  Risk Factors Current exercise habits: some  Dietary issues discussed: none   Cardiac risk factors: advanced age (older than 17 for men, 64 for women).  Depression Screen (Note: if answer to either of the following is "Yes", a more complete depression screening is indicated)   Q1: Over the past two weeks, have you felt down, depressed or hopeless? No  Q2: Over the past two weeks, have you felt little interest or pleasure in doing things? No  Have you lost interest or pleasure in daily life? No  Do you often feel hopeless? No  Do you cry easily over simple problems? No  Activities of Daily Living In your present state of health, do you have any difficulty performing the following activities?:  Driving? No Managing money?  No Feeding yourself? No Getting from bed to chair? No Climbing a  flight of stairs? No Preparing food and eating?: No Bathing or showering? No Getting dressed: No Getting to the toilet? No Using the toilet:No Moving around from place to place: No In the past year have you fallen or had a near fall?:No   Are you sexually active?  No  Do you have more than one partner?  No  Hearing Difficulties: No Do you often ask people to speak up or repeat themselves? No Do you experience ringing or noises in your ears? No Do you have difficulty understanding soft or whispered voices? Yes   Do you feel that you have a problem with memory? No  Do you often misplace items? No  Do you feel safe at home?  Yes  Cognitive Testing  Alert? Yes  Normal Appearance?Yes  Oriented to person? Yes  Place? Yes   Time? Yes  Recall of three objects?  Yes  Can perform simple calculations? Yes  Displays appropriate judgment?Yes  Can read the correct time from a watch face?Yes   Advanced Directives have been discussed with the patient? Yes   List the Names of Other Physician/Practitioners you currently use: 1.  Dr. Darnelle Maffucci, dentist 2. Dr. Jackquline Bosch - eye 3. Dr. Emily Filbert - dermatology  Indicate any recent Medical Services you may have received from other than [facility] providers in the past year (date may be approximate).  Immunization History  Administered Date(s) Administered  . Influenza Split 03/19/2012  . Influenza, High Dose Seasonal PF 04/21/2013  . Pneumococcal Conjugate-13 04/30/2006  . Tdap 05/01/2010  . Zoster 04/03/2006    Screening Tests Health Maintenance  Topic Date Due  . Influenza Vaccine  01/23/2014  . Colonoscopy  06/11/2016  . Tetanus/tdap  05/01/2020  . Pneumococcal Polysaccharide Vaccine Age 63 And Over  Completed  . Zostavax  Completed    All answers were reviewed with the patient and necessary referrals were made:  Jahi Roza, MD   05/18/2013   History reviewed: allergies, current medications, past family history, past medical  history, past social history, past surgical history and problem list  Review of Systems A comprehensive review of systems was negative.    Objective:     Vision by Snellen chart: right eye : sees Dr. Esperanza Heir for vision Blood pressure 126/63, pulse 53, temperature 97.7 F (36.5 C), temperature source Oral, height 5' 9.6" (1.768 m), weight 168 lb (76.204 kg). Body mass index is 24.38 kg/(m^2).  BP 126/63  Pulse 53  Temp(Src) 97.7 F (36.5 C) (Oral)  Ht 5' 9.6" (1.768 m)  Wt 168 lb (76.204 kg)  BMI 24.38  kg/m2  General Appearance:    Alert, cooperative, no distress, appears stated age  Head:    Normocephalic, without obvious abnormality, atraumatic  Eyes:    PERRL, conjunctiva/corneas clear, EOM's intact, both eyes       Ears:    Normal TM's and external ear canals, both ears  Nose:   Nares normal, septum midline, mucosa normal, no drainage    or sinus tenderness  Throat:   Lips, mucosa, and tongue normal; teeth and gums normal  Neck:   Supple, symmetrical, trachea midline, no adenopathy;       thyroid:  No enlargement/tenderness/nodules; no carotid   bruit or JVD  Back:     Symmetric, no curvature, ROM normal, no CVA tenderness  Lungs:     Clear to auscultation bilaterally, respirations unlabored  Chest wall:    No tenderness or deformity  Heart:    Regular rate and rhythm, S1 and S2 normal, no murmur, rub   or gallop  Abdomen:     Soft, non-tender, bowel sounds active all four quadrants,    no masses, no organomegaly  Genitalia:    Not performed.   Rectal:    Not performed.   Extremities:   Extremities normal, atraumatic, no cyanosis or edema  Pulses:   2+ and symmetric all extremities  Skin:   Skin color, texture, turgor normal, no rashes or lesions  Lymph nodes:   Cervical, supraclavicular nodes normal  Neurologic:   CNII-XII intact. Normal strength, sensation and reflexes      throughout       Assessment:     Annual medicare Wellness Exam      Plan:     During  the course of the visit the patient was educated and counseled about appropriate screening and preventive services including:    Influenza vaccine - done  Scheduled for eye exam.    HTN- due for lipids.   Diet review for nutrition referral? Yes ____  Not Indicated _X _   Patient Instructions (the written plan) was given to the patient.  Medicare Attestation I have personally reviewed: The patient's medical and social history Their use of alcohol, tobacco or illicit drugs Their current medications and supplements The patient's functional ability including ADLs,fall risks, home safety risks, cognitive, and hearing and visual impairment Diet and physical activities Evidence for depression or mood disorders  The patient's weight, height, BMI, and visual acuity have been recorded in the chart.  I have made referrals, counseling, and provided education to the patient based on review of the above and I have provided the patient with a written personalized care plan for preventive services.     Cynthea Zachman, MD   05/18/2013

## 2013-05-27 ENCOUNTER — Other Ambulatory Visit: Payer: Self-pay | Admitting: Family Medicine

## 2013-11-17 ENCOUNTER — Ambulatory Visit (INDEPENDENT_AMBULATORY_CARE_PROVIDER_SITE_OTHER): Payer: Managed Care, Other (non HMO) | Admitting: Family Medicine

## 2013-11-17 ENCOUNTER — Encounter: Payer: Self-pay | Admitting: Family Medicine

## 2013-11-17 VITALS — BP 119/60 | HR 59 | Ht 69.6 in | Wt 172.0 lb

## 2013-11-17 DIAGNOSIS — M2061 Acquired deformities of toe(s), unspecified, right foot: Secondary | ICD-10-CM | POA: Insufficient documentation

## 2013-11-17 DIAGNOSIS — I10 Essential (primary) hypertension: Secondary | ICD-10-CM

## 2013-11-17 DIAGNOSIS — W57XXXA Bitten or stung by nonvenomous insect and other nonvenomous arthropods, initial encounter: Secondary | ICD-10-CM

## 2013-11-17 LAB — BASIC METABOLIC PANEL WITH GFR
BUN: 21 mg/dL (ref 6–23)
CHLORIDE: 104 meq/L (ref 96–112)
CO2: 30 mEq/L (ref 19–32)
Calcium: 9.5 mg/dL (ref 8.4–10.5)
Creat: 1 mg/dL (ref 0.50–1.35)
GFR, EST NON AFRICAN AMERICAN: 71 mL/min
GFR, Est African American: 82 mL/min
Glucose, Bld: 84 mg/dL (ref 70–99)
POTASSIUM: 4.2 meq/L (ref 3.5–5.3)
Sodium: 140 mEq/L (ref 135–145)

## 2013-11-17 MED ORDER — LOVASTATIN 40 MG PO TABS: ORAL_TABLET | ORAL | Status: DC

## 2013-11-17 MED ORDER — HYDROCHLOROTHIAZIDE 25 MG PO TABS: ORAL_TABLET | ORAL | Status: DC

## 2013-11-17 NOTE — Patient Instructions (Signed)
Quit taking your blood pressure pill about a month before you come back in.

## 2013-11-17 NOTE — Progress Notes (Signed)
   Subjective:    Patient ID: Patrick Perry, male    DOB: [birth year], 80 y.o.   MRN: [id number]  HPI Hypertension- here for 6 month f/u.   Pt denies chest pain, SOB, dizziness, or heart palpitations.  Taking meds as directed w/o problems.  Denies medication side effects.  Did pull his calf muscle a couple of weeks ago after moving a lot of mulch.  He only takes half a tab hydrochlorothiazide.  Found a tick this AM on lower abdomen.  Was out pulling out wood off an old shed.    Review of Systems     Objective:   Physical Exam  Constitutional: He is oriented to person, place, and time. He appears well-developed and well-nourished.  HENT:  Head: Normocephalic and atraumatic.  Mouth/Throat: Oropharynx is clear and moist.  Cardiovascular: Normal rate, regular rhythm and normal heart sounds.   Pulmonary/Chest: Effort normal and breath sounds normal.  Neurological: He is alert and oriented to person, place, and time.  Skin: Skin is warm and dry.  Psychiatric: He has a normal mood and affect. His behavior is normal.          Assessment & Plan:  Hypertension- well controlled. Will check BMP.  We discussed different options his blood pressures look fantastic the last 4 times he has been here. We could certainly consider stopping the diuretic about a month before his next followup appointment and see if his blood pressure looks well controlled without it. He exercises regularly walking about 2 miles per day.  Tick bite-discussed Reglan 10 warning symptoms for a few minutes but if fever or Lyme's disease. Also if he feels like the wound itself is becoming infected then please let us know.

## 2013-11-18 NOTE — Progress Notes (Signed)
Quick Note:  All labs are normal. ______ 

## 2014-05-25 ENCOUNTER — Ambulatory Visit (INDEPENDENT_AMBULATORY_CARE_PROVIDER_SITE_OTHER): Payer: Managed Care, Other (non HMO) | Admitting: Family Medicine

## 2014-05-25 ENCOUNTER — Encounter: Payer: Self-pay | Admitting: Family Medicine

## 2014-05-25 VITALS — BP 137/70 | HR 57 | Temp 97.3°F | Ht 69.5 in | Wt 164.0 lb

## 2014-05-25 DIAGNOSIS — I10 Essential (primary) hypertension: Secondary | ICD-10-CM

## 2014-05-25 DIAGNOSIS — Z125 Encounter for screening for malignant neoplasm of prostate: Secondary | ICD-10-CM

## 2014-05-25 DIAGNOSIS — Z0000 Encounter for general adult medical examination without abnormal findings: Secondary | ICD-10-CM

## 2014-05-25 DIAGNOSIS — Z23 Encounter for immunization: Secondary | ICD-10-CM

## 2014-05-25 DIAGNOSIS — M2242 Chondromalacia patellae, left knee: Secondary | ICD-10-CM

## 2014-05-25 LAB — COMPLETE METABOLIC PANEL WITH GFR
ALBUMIN: 4.3 g/dL (ref 3.5–5.2)
ALT: 10 U/L (ref 0–53)
AST: 31 U/L (ref 0–37)
Alkaline Phosphatase: 59 U/L (ref 39–117)
BUN: 14 mg/dL (ref 6–23)
CALCIUM: 9.3 mg/dL (ref 8.4–10.5)
CHLORIDE: 105 meq/L (ref 96–112)
CO2: 29 mEq/L (ref 19–32)
Creat: 0.98 mg/dL (ref 0.50–1.35)
GFR, EST AFRICAN AMERICAN: 84 mL/min
GFR, Est Non African American: 73 mL/min
Glucose, Bld: 79 mg/dL (ref 70–99)
POTASSIUM: 4 meq/L (ref 3.5–5.3)
Sodium: 139 mEq/L (ref 135–145)
Total Bilirubin: 0.5 mg/dL (ref 0.2–1.2)
Total Protein: 7.5 g/dL (ref 6.0–8.3)

## 2014-05-25 LAB — LIPID PANEL
Cholesterol: 156 mg/dL (ref 0–200)
HDL: 52 mg/dL (ref >39)
LDL CALC: 84 mg/dL (ref 0–99)
Total CHOL/HDL Ratio: 3 Ratio
Triglycerides: 102 mg/dL (ref <150)
VLDL: 20 mg/dL (ref 0–40)

## 2014-05-25 NOTE — Patient Instructions (Signed)
Keep up a regular exercise program and make sure you are eating a healthy diet Try to eat 4 servings of dairy a day, or if you are lactose intolerant take a calcium with vitamin D daily.  Your vaccines are up to date.   

## 2014-05-25 NOTE — Progress Notes (Signed)
Subjective:    Patrick Perry is a 80 y.o. male who presents for Medicare Annual/Subsequent preventive examination.   Preventive Screening-Counseling & Management  Tobacco History  Smoking status  . Never Smoker   Smokeless tobacco  . Not on file    Problems Prior to Visit 1. HTN - Brought log of BPs for mre to review.  2.  Left knee pain - says will get stiff and sore esp after sitting for period of time.  Hx of old injury.  Had knee ligamament repair years ago .  No swelling. Not using any OTC medications.   Current Problems (verified) Patient Active Problem List   Diagnosis Date Noted  . Acquired deformity of right toe 11/17/2013  . Bilateral inguinal hernia 09/16/2012  . Inguinal hernia 09/09/2012  . BPH (benign prostatic hyperplasia) 05/10/2012  . Rectal stricture 05/10/2012  . Subluxation of metatarsophalangeal joint of lesser toe of right foot 05/10/2012  . Leg length discrepancy 05/09/2012  . Essential hypertension, benign 05/09/2012  . Other and unspecified hyperlipidemia 05/09/2012    Medications Prior to Visit Current Outpatient Prescriptions on File Prior to Visit  Medication Sig Dispense Refill  . aspirin 81 MG tablet Take 81 mg by mouth daily.    Marland Kitchen b complex vitamins capsule Take 1 capsule by mouth daily.    . Coenzyme Q10 (CO Q-10) 100 MG CAPS Take 1 capsule by mouth daily.    . fish oil-omega-3 fatty acids 1000 MG capsule Take 2 g by mouth daily.    Marland Kitchen glucosamine-chondroitin 500-400 MG tablet Take 1 tablet by mouth daily.    . hydrochlorothiazide (HYDRODIURIL) 25 MG tablet take 1/2 tablets by mouth once daily 90 tablet 2  . lovastatin (MEVACOR) 40 MG tablet take 1 tablet by mouth at bedtime 90 tablet 3  . Misc Natural Products (PROSTATE) CAPS Take 1 tablet by mouth 2 (two) times daily.    . vitamin C (ASCORBIC ACID) 500 MG tablet Take 500 mg by mouth daily.     No current facility-administered medications on file prior to visit.    Current  Medications (verified) Current Outpatient Prescriptions  Medication Sig Dispense Refill  . aspirin 81 MG tablet Take 81 mg by mouth daily.    Marland Kitchen b complex vitamins capsule Take 1 capsule by mouth daily.    . Cholecalciferol (VITAMIN D-3) 1000 UNITS CAPS Take by mouth 2 (two) times daily.    . Coenzyme Q10 (CO Q-10) 100 MG CAPS Take 1 capsule by mouth daily.    . fish oil-omega-3 fatty acids 1000 MG capsule Take 2 g by mouth daily.    Marland Kitchen glucosamine-chondroitin 500-400 MG tablet Take 1 tablet by mouth daily.    . hydrochlorothiazide (HYDRODIURIL) 25 MG tablet take 1/2 tablets by mouth once daily 90 tablet 2  . lovastatin (MEVACOR) 40 MG tablet take 1 tablet by mouth at bedtime 90 tablet 3  . Misc Natural Products (PROSTATE) CAPS Take 1 tablet by mouth 2 (two) times daily.    . vitamin C (ASCORBIC ACID) 500 MG tablet Take 500 mg by mouth daily.     No current facility-administered medications for this visit.     Allergies (verified) Clindamycin/lincomycin   PAST HISTORY  Family History Family History  Problem Relation Age of Onset  . Prostate cancer Brother     Social History History  Substance Use Topics  . Smoking status: Never Smoker   . Smokeless tobacco: Not on file  . Alcohol Use: No  Are there smokers in your home (other than you)?  No  Risk Factors Current exercise habits: exercises 3 time sper week, treadmill and total gym  Dietary issues discussed: None   Cardiac risk factors: dyslipidemia, hypertension and male gender.  Depression Screen (Note: if answer to either of the following is "Yes", a more complete depression screening is indicated)   Q1: Over the past two weeks, have you felt down, depressed or hopeless? No  Q2: Over the past two weeks, have you felt little interest or pleasure in doing things? No  Have you lost interest or pleasure in daily life? No  Do you often feel hopeless? No  Do you cry easily over simple problems? No  Activities of Daily  Living In your present state of health, do you have any difficulty performing the following activities?:  Driving? No Managing money?  No Feeding yourself? No Getting from bed to chair? No Climbing a flight of stairs? No Preparing food and eating?: No Bathing or showering? No Getting dressed: No Getting to the toilet? No Using the toilet:No Moving around from place to place: No In the past year have you fallen or had a near fall?:No   Are you sexually active?  No  Do you have more than one partner?  No  Hearing Difficulties: Yes Do you often ask people to speak up or repeat themselves? Yes Do you experience ringing or noises in your ears? No Do you have difficulty understanding soft or whispered voices? Yes   Do you feel that you have a problem with memory? No  Do you often misplace items? No  Do you feel safe at home?  Yes  Cognitive Testing  Alert? Yes  Normal Appearance?Yes  Oriented to person? Yes  Place? Yes   Time? Yes  Recall of three objects?  Yes  Can perform simple calculations? Yes  Displays appropriate judgment?Yes  Can read the correct time from a watch face?Yes   Advanced Directives have been discussed with the patient? Yes   List the Names of Other Physician/Practitioners you currently use: 1.  Has appt for eye exam with Dr. Hoyle Sauer 2. Dr. Delman Cheadle - dermatology 3. Dr. Gilford Rile Dentist  Indicate any recent Medical Services you may have received from other than [facility] providers in the past year (date may be approximate).  Immunization History  Administered Date(s) Administered  . Influenza Split 03/19/2012  . Influenza, High Dose Seasonal PF 04/21/2013  . Pneumococcal Conjugate-13 04/30/2006  . Tdap 05/01/2010  . Zoster 04/03/2006    Screening Tests Health Maintenance  Topic Date Due  . INFLUENZA VACCINE  01/23/2014  . COLONOSCOPY  06/11/2016  . TETANUS/TDAP  05/01/2020  . PNEUMOCOCCAL POLYSACCHARIDE VACCINE AGE 3 AND OVER  Completed  .  ZOSTAVAX  Completed    All answers were reviewed with the patient and necessary referrals were made:  Klay Sobotka, MD   05/25/2014   History reviewed: allergies, current medications, past family history, past medical history, past social history, past surgical history and problem list  Review of Systems A comprehensive review of systems was negative.    Objective:     Vision by Snellen chart: Has eye appt ascheduled.   Blood pressure 137/70, pulse 57, temperature 97.3 F (36.3 C), height 5' 9.5" (1.765 m), weight 164 lb (74.39 kg), SpO2 96 %. Body mass index is 23.88 kg/(m^2).  BP 137/70 mmHg  Pulse 57  Temp(Src) 97.3 F (36.3 C)  Ht 5' 9.5" (1.765 m)  Wt 164  lb (74.39 kg)  BMI 23.88 kg/m2  SpO2 96%  General Appearance:    Alert, cooperative, no distress, appears stated age  Head:    Normocephalic, without obvious abnormality, atraumatic  Eyes:    PERRL, conjunctiva/corneas clear, EOM's intact, both eyes       Ears:    Normal TM's and external ear canals, both ears  Nose:   Nares normal, septum midline, mucosa normal, no drainage    or sinus tenderness  Throat:   Lips, mucosa, and tongue normal; teeth and gums normal  Neck:   Supple, symmetrical, trachea midline, no adenopathy;       thyroid:  No enlargement/tenderness/nodules; no carotid   bruit or JVD  Back:     Symmetric, no curvature, ROM normal, no CVA tenderness  Lungs:     Clear to auscultation bilaterally, respirations unlabored  Chest wall:    No tenderness or deformity  Heart:    Regular rate and rhythm, S1 and S2 normal, no murmur, rub   or gallop  Abdomen:     Soft, non-tender, bowel sounds active all four quadrants,    no masses, no organomegaly  Genitalia:    Not examined   Rectal:    Not examined  Extremities:   Extremities normal, atraumatic, no cyanosis or edema  Pulses:   2+ and symmetric all extremities  Skin:   Skin color, texture, turgor normal, no rashes or lesions  Lymph nodes:   Cervical,  supraclavicular, and axillary nodes normal  Neurologic:   CNII-XII intact. Normal strength, sensation and reflexes      throughout      Left knee with some crepitus. Normal range of motion. Nontender along the joint lines around the patella. Normal strength and range of motion at the knee and ankle. Negative McMurray's.   Assessment:     Medicare wellness Exam       Plan:     During the course of the visit the patient was educated and counseled about appropriate screening and preventive services including:    Pneumococcal vaccine    Chondromalacia patella. H.O provided with exercises to do at home.    Diet review for nutrition referral? Yes ____  Not Indicated __X_   Patient Instructions (the written plan) was given to the patient.  Medicare Attestation I have personally reviewed: The patient's medical and social history Their use of alcohol, tobacco or illicit drugs Their current medications and supplements The patient's functional ability including ADLs,fall risks, home safety risks, cognitive, and hearing and visual impairment Diet and physical activities Evidence for depression or mood disorders  The patient's weight, height, BMI, and visual acuity have been recorded in the chart.  I have made referrals, counseling, and provided education to the patient based on review of the above and I have provided the patient with a written personalized care plan for preventive services.     Lavonia Eager, MD   05/25/2014

## 2014-05-25 NOTE — Addendum Note (Signed)
Addended by: Teddy Spike on: 05/25/2014 11:49 AM   Modules accepted: Orders

## 2014-05-26 LAB — PSA: PSA: 1.93 ng/mL (ref <=4.00)

## 2014-05-26 NOTE — Progress Notes (Signed)
Quick Note:  All labs are normal. ______ 

## 2014-08-17 ENCOUNTER — Telehealth: Payer: Self-pay | Admitting: *Deleted

## 2014-08-17 NOTE — Telephone Encounter (Signed)
Pt called and stated that he was seen by Dr. Jodi Mourning yesterday for an eye appt. And she noticed some things on his exam and Dr. Jodi Mourning fwd this to Dr. Madilyn Fireman for advice. And wanted to know if he should be seen earlier because of c/o complaining of coldness in his hands and feet. appt made for 2/29 for this.Patrick Perry [place]

## 2014-08-23 ENCOUNTER — Ambulatory Visit (INDEPENDENT_AMBULATORY_CARE_PROVIDER_SITE_OTHER): Payer: Managed Care, Other (non HMO) | Admitting: Family Medicine

## 2014-08-23 ENCOUNTER — Encounter: Payer: Self-pay | Admitting: Family Medicine

## 2014-08-23 VITALS — BP 130/70 | HR 60 | Wt 175.0 lb

## 2014-08-23 DIAGNOSIS — R208 Other disturbances of skin sensation: Secondary | ICD-10-CM

## 2014-08-23 DIAGNOSIS — R209 Unspecified disturbances of skin sensation: Secondary | ICD-10-CM

## 2014-08-23 NOTE — Progress Notes (Signed)
   Subjective:    Patient ID: Patrick Perry, male    DOB: April 10, 1933, 81 y.o.   MRN: [id number]  HPI Right hand and right foot feel cold intermittantly. Started a couple of months ago. A week ago ws outside when very cold ot get his mail he noticed his middle finger was white and the end was tingling.  Then it warmed up and resolved. No cramping.     Review of Systems No CP or SOB.     Objective:   Physical Exam  Constitutional: He appears well-nourished.  Musculoskeletal:  Good color to both feet bilaterally. 1+ dorsal pedal pulses and 1+ posterior tibial pulses bilaterally. He does have some dystrophic nails and a abnormal second toe on the right foot secondary to foot surgery. No swelling or edema around the ankles bilaterally.  Skin: Skin is warm and dry.  Psychiatric: He has a normal mood and affect. His behavior is normal.     He also has no hair on his lower extreme ease bilaterally.     Assessment & Plan:  Cold right hand/right foot - unclaer etiology. Consider reynauds. Will check for inc sed rate, thyroid and blood count. We'll call results once available. I was able to palpate dorsal pedal pulses but we'll go ahead and order ABIs as well. At the age of 81 he would be at increased risk for peripheral vascular disease.

## 2014-08-25 ENCOUNTER — Ambulatory Visit (HOSPITAL_COMMUNITY): Payer: Medicare HMO | Attending: Cardiovascular Disease | Admitting: *Deleted

## 2014-08-25 DIAGNOSIS — R208 Other disturbances of skin sensation: Secondary | ICD-10-CM

## 2014-08-25 DIAGNOSIS — R209 Unspecified disturbances of skin sensation: Secondary | ICD-10-CM

## 2014-08-25 NOTE — Progress Notes (Signed)
Lower Extremity Arterial Doppler Exam

## 2014-11-24 ENCOUNTER — Encounter: Payer: Self-pay | Admitting: Family Medicine

## 2014-11-24 ENCOUNTER — Ambulatory Visit (INDEPENDENT_AMBULATORY_CARE_PROVIDER_SITE_OTHER): Payer: Medicare HMO | Admitting: Family Medicine

## 2014-11-24 ENCOUNTER — Other Ambulatory Visit: Payer: Self-pay | Admitting: Family Medicine

## 2014-11-24 VITALS — BP 125/72 | HR 55 | Ht 70.0 in | Wt 172.0 lb

## 2014-11-24 DIAGNOSIS — E785 Hyperlipidemia, unspecified: Secondary | ICD-10-CM

## 2014-11-24 DIAGNOSIS — I10 Essential (primary) hypertension: Secondary | ICD-10-CM | POA: Diagnosis not present

## 2014-11-24 LAB — BASIC METABOLIC PANEL
BUN: 19 mg/dL (ref 6–23)
CO2: 29 mEq/L (ref 19–32)
Calcium: 9.1 mg/dL (ref 8.4–10.5)
Chloride: 103 mEq/L (ref 96–112)
Creat: 0.94 mg/dL (ref 0.50–1.35)
GLUCOSE: 73 mg/dL (ref 70–99)
Potassium: 4.2 mEq/L (ref 3.5–5.3)
SODIUM: 141 meq/L (ref 135–145)

## 2014-11-24 MED ORDER — HYDROCHLOROTHIAZIDE 25 MG PO TABS: ORAL_TABLET | ORAL | Status: DC

## 2014-11-24 MED ORDER — LOVASTATIN 40 MG PO TABS: ORAL_TABLET | ORAL | Status: DC

## 2014-11-24 NOTE — Progress Notes (Signed)
   Subjective:    Patient ID: Patrick Perry, male    DOB: 28-Mar-1933, 81 y.o.   MRN: [id number]  HPI Hypertension- Pt denies chest pain, SOB, dizziness, or heart palpitations.  Taking meds as directed w/o problems.  Denies medication side effects.  Walks on the treadmill 3 times a week.   Hyerlpidemia - his is on mevacor. Has some questions about statins. He is tolerating it well without any myalgias or side effects.  Lab Results  Component Value Date   CHOL 156 05/25/2014   HDL 52 05/25/2014   LDLCALC 84 05/25/2014   TRIG 102 05/25/2014   CHOLHDL 3.0 05/25/2014      Review of Systems     Objective:   Physical Exam  Constitutional: He is oriented to person, place, and time. He appears well-developed and well-nourished.  HENT:  Head: Normocephalic and atraumatic.  Cardiovascular: Normal rate, regular rhythm and normal heart sounds.   Pulmonary/Chest: Effort normal and breath sounds normal.  Neurological: He is alert and oriented to person, place, and time.  Skin: Skin is warm and dry.  Psychiatric: He has a normal mood and affect. His behavior is normal.          Assessment & Plan:  HTN  - well-controlled. Continue current regimen. Follow up in 6 months. Due for BMP.  Hyperlipidemia - Discussed the statins and the risks and benefits.  I did encourage him to continue the medications that he has not expressed any side effects or problems.  Consider stopping fish oil.

## 2014-11-25 NOTE — Progress Notes (Signed)
Quick Note:  All labs are normal. ______ 

## 2015-04-07 ENCOUNTER — Ambulatory Visit (INDEPENDENT_AMBULATORY_CARE_PROVIDER_SITE_OTHER): Payer: Medicare HMO | Admitting: Sports Medicine

## 2015-04-07 VITALS — Temp 98.0°F

## 2015-04-07 DIAGNOSIS — Z23 Encounter for immunization: Secondary | ICD-10-CM

## 2015-06-01 ENCOUNTER — Telehealth: Payer: Self-pay | Admitting: Family Medicine

## 2015-06-01 ENCOUNTER — Ambulatory Visit (INDEPENDENT_AMBULATORY_CARE_PROVIDER_SITE_OTHER): Payer: Medicare HMO | Admitting: Family Medicine

## 2015-06-01 ENCOUNTER — Encounter: Payer: Self-pay | Admitting: Family Medicine

## 2015-06-01 VITALS — BP 124/76 | HR 57 | Temp 97.5°F | Resp 16 | Wt 168.2 lb

## 2015-06-01 DIAGNOSIS — Z0000 Encounter for general adult medical examination without abnormal findings: Secondary | ICD-10-CM | POA: Diagnosis not present

## 2015-06-01 DIAGNOSIS — I10 Essential (primary) hypertension: Secondary | ICD-10-CM | POA: Diagnosis not present

## 2015-06-01 DIAGNOSIS — N400 Enlarged prostate without lower urinary tract symptoms: Secondary | ICD-10-CM | POA: Diagnosis not present

## 2015-06-01 LAB — COMPLETE METABOLIC PANEL WITH GFR
ALBUMIN: 4.1 g/dL (ref 3.6–5.1)
ALK PHOS: 65 U/L (ref 40–115)
ALT: 11 U/L (ref 9–46)
AST: 26 U/L (ref 10–35)
BUN: 16 mg/dL (ref 7–25)
CALCIUM: 9.3 mg/dL (ref 8.6–10.3)
CO2: 28 mmol/L (ref 20–31)
CREATININE: 0.93 mg/dL (ref 0.70–1.11)
Chloride: 104 mmol/L (ref 98–110)
GFR, Est African American: 89 mL/min (ref >=60)
GFR, Est Non African American: 77 mL/min (ref >=60)
GLUCOSE: 88 mg/dL (ref 65–99)
Potassium: 4 mmol/L (ref 3.5–5.3)
SODIUM: 141 mmol/L (ref 135–146)
Total Bilirubin: 0.3 mg/dL (ref 0.2–1.2)
Total Protein: 7.1 g/dL (ref 6.1–8.1)

## 2015-06-01 LAB — LIPID PANEL
Cholesterol: 141 mg/dL (ref 125–200)
HDL: 41 mg/dL (ref >=40)
LDL CALC: 79 mg/dL (ref <130)
Total CHOL/HDL Ratio: 3.4 Ratio (ref <=5.0)
Triglycerides: 103 mg/dL (ref <150)
VLDL: 21 mg/dL (ref <30)

## 2015-06-01 NOTE — Progress Notes (Signed)
Subjective:    Patrick Perry is a 81 y.o. male who presents for Medicare Annual/Subsequent preventive examination.   Preventive Screening-Counseling & Management  Tobacco History  Smoking status  . Never Smoker   Smokeless tobacco  . Not on file    Problems Prior to Visit 1.   Current Problems (verified) Patient Active Problem List   Diagnosis Date Noted  . Acquired deformity of right toe 11/17/2013  . Bilateral inguinal hernia 09/16/2012  . Inguinal hernia 09/09/2012  . BPH (benign prostatic hyperplasia) 05/10/2012  . Rectal stricture 05/10/2012  . Subluxation of metatarsophalangeal joint of lesser toe of right foot 05/10/2012  . Leg length discrepancy 05/09/2012  . Essential hypertension, benign 05/09/2012  . Other and unspecified hyperlipidemia 05/09/2012    Medications Prior to Visit Current Outpatient Prescriptions on File Prior to Visit  Medication Sig Dispense Refill  . aspirin 81 MG tablet Take 81 mg by mouth daily.    Marland Kitchen b complex vitamins capsule Take 1 capsule by mouth daily.    . Cholecalciferol (VITAMIN D-3) 1000 UNITS CAPS Take by mouth 2 (two) times daily.    . Coenzyme Q10 (CO Q-10) 100 MG CAPS Take 1 capsule by mouth daily.    . hydrochlorothiazide (HYDRODIURIL) 25 MG tablet take 1/2 tablets by mouth once daily 90 tablet 2  . lovastatin (MEVACOR) 40 MG tablet take 1 tablet by mouth at bedtime 90 tablet 3  . Misc Natural Products (PROSTATE) CAPS Take 1 tablet by mouth 2 (two) times daily.    . vitamin C (ASCORBIC ACID) 500 MG tablet Take 500 mg by mouth daily.     No current facility-administered medications on file prior to visit.    Current Medications (verified) Current Outpatient Prescriptions  Medication Sig Dispense Refill  . aspirin 81 MG tablet Take 81 mg by mouth daily.    Marland Kitchen b complex vitamins capsule Take 1 capsule by mouth daily.    . Cholecalciferol (VITAMIN D-3) 1000 UNITS CAPS Take by mouth 2 (two) times daily.    . Coenzyme Q10  (CO Q-10) 100 MG CAPS Take 1 capsule by mouth daily.    . hydrochlorothiazide (HYDRODIURIL) 25 MG tablet take 1/2 tablets by mouth once daily 90 tablet 2  . lovastatin (MEVACOR) 40 MG tablet take 1 tablet by mouth at bedtime 90 tablet 3  . Misc Natural Products (PROSTATE) CAPS Take 1 tablet by mouth 2 (two) times daily.    . vitamin C (ASCORBIC ACID) 500 MG tablet Take 500 mg by mouth daily.     No current facility-administered medications for this visit.     Allergies (verified) Clindamycin/lincomycin   PAST HISTORY  Family History Family History  Problem Relation Age of Onset  . Prostate cancer Brother     Social History Social History  Substance Use Topics  . Smoking status: Never Smoker   . Smokeless tobacco: Not on file  . Alcohol Use: No    Are there smokers in your home (other than you)?  No  Risk Factors Current exercise habits: exercises 3 days per week and is very active.   Dietary issues discussed: None   Cardiac risk factors: hypertension.  Depression Screen (Note: if answer to either of the following is "Yes", a more complete depression screening is indicated)   Q1: Over the past two weeks, have you felt down, depressed or hopeless? No  Q2: Over the past two weeks, have you felt little interest or pleasure in doing things? No  Have you lost interest or pleasure in daily life? No  Do you often feel hopeless? No  Do you cry easily over simple problems? No  Activities of Daily Living In your present state of health, do you have any difficulty performing the following activities?:  Driving? No Managing money?  No Feeding yourself? No Getting from bed to chair? No Climbing a flight of stairs? No Preparing food and eating?: No Bathing or showering? No Getting dressed: No Getting to the toilet? No Using the toilet:No Moving around from place to place: No In the past year have you fallen or had a near fall?:Yes, fell down the stairs taking pecans to the  basemen.  Miss stepped with arms full.     Hearing Difficulties: Yes Do you often ask people to speak up or repeat themselves? Yes Do you experience ringing or noises in your ears? No Do you have difficulty understanding soft or whispered voices? Yes   Do you feel that you have a problem with memory? No  Do you often misplace items? No  Do you feel safe at home?  Yes  Cognitive Testing  Alert? Yes  Normal Appearance?Yes  Oriented to person? Yes  Place? Yes   Time? Yes  Recall of three objects?  Yes  Can perform simple calculations? Yes  Displays appropriate judgment?Yes  Can read the correct time from a watch face?Yes   Advanced Directives have been discussed with the patient? Yes    6 CIT score of 2/28 ( normal )    List the Names of Other Physician/Practitioners you currently use: 1.  Dr. Delman Cheadle - Dermatolgy 2. Dr. Jodi Mourning - Optometry  Indicate any recent Medical Services you may have received from other than [facility] providers in the past year (date may be approximate).  Immunization History  Administered Date(s) Administered  . Influenza Split 03/19/2012  . Influenza, High Dose Seasonal PF 04/21/2013  . Influenza,inj,Quad PF,36+ Mos 04/07/2015  . Pneumococcal Conjugate-13 05/25/2014  . Pneumococcal Polysaccharide-23 04/30/2006  . Tdap 05/01/2010  . Zoster 04/03/2006    Screening Tests Health Maintenance  Topic Date Due  . INFLUENZA VACCINE  01/24/2016  . COLONOSCOPY  06/11/2016  . TETANUS/TDAP  05/01/2020  . ZOSTAVAX  Completed  . PNA vac Low Risk Adult  Completed    All answers were reviewed with the patient and necessary referrals were made:  Devery Murgia, MD   06/01/2015   History reviewed: allergies, current medications, past family history, past medical history, past social history, past surgical history and problem list  Review of Systems Pertinent items noted in HPI and remainder of comprehensive ROS otherwise negative.    Objective:      Vision by Snellen chart:Sees Dr. Etheleen Sia.   Blood pressure 153/62, pulse 57, temperature 97.5 F (36.4 C), temperature source Oral, resp. rate 16, weight 168 lb 3.2 oz (76.295 kg), SpO2 100 %. Body mass index is 24.13 kg/(m^2).  BP 153/62 mmHg  Pulse 57  Temp(Src) 97.5 F (36.4 C) (Oral)  Resp 16  Wt 168 lb 3.2 oz (76.295 kg)  SpO2 100%  General Appearance:    Alert, cooperative, no distress, appears stated age  Head:    Normocephalic, without obvious abnormality, atraumatic  Eyes:    PERRL, conjunctiva/corneas clear, EOM's intact, both eyes       Ears:    Normal TM's and external ear canals, both ears  Nose:   Nares normal, septum midline, mucosa normal, no drainage    or sinus tenderness  Throat:   Lips, mucosa, and tongue normal; teeth and gums normal  Neck:   Supple, symmetrical, trachea midline, no adenopathy;       thyroid:  No enlargement/tenderness/nodules; no carotid   bruit or JVD  Back:     Symmetric, no curvature, ROM normal, no CVA tenderness  Lungs:     Clear to auscultation bilaterally, respirations unlabored  Chest wall:    No tenderness or deformity  Heart:    Regular rate and rhythm, S1 and S2 normal, no murmur, rub   or gallop  Abdomen:     Soft, non-tender, bowel sounds active all four quadrants,    no masses, no organomegaly  Genitalia:    Not examine  Rectal:    Not examined  Extremities:   Extremities normal, atraumatic, no cyanosis or edema  Pulses:   2+ and symmetric all extremities  Skin:   Skin color, texture, turgor normal, no rashes or lesions  Lymph nodes:   Cervical, supraclavicular, and axillary nodes normal  Neurologic:   CNII-XII intact. Normal strength, sensation and reflexes      throughout       Assessment:     Annual Medicare Wellness Exam       Plan:     During the course of the visit the patient was educated and counseled about appropriate screening and preventive services including:    will need to check to see if due for  coloncopy.  Had scope in 2012 and had a tubular polyp.     HTN - well controlled. F/U in 6 months.     Diet review for nutrition referral? Yes ____  Not Indicated __X_   Patient Instructions (the written plan) was given to the patient.  Medicare Attestation I have personally reviewed: The patient's medical and social history Their use of alcohol, tobacco or illicit drugs Their current medications and supplements The patient's functional ability including ADLs,fall risks, home safety risks, cognitive, and hearing and visual impairment Diet and physical activities Evidence for depression or mood disorders  The patient's weight, height, BMI, and visual acuity have been recorded in the chart.  I have made referrals, counseling, and provided education to the patient based on review of the above and I have provided the patient with a written personalized care plan for preventive services.     Julya Alioto, MD   06/01/2015

## 2015-06-01 NOTE — Telephone Encounter (Signed)
Please call [facility]. His colonoscopy was performed in 2012. At the time and recommended a 5 year follow-up. He had a single 5 mm polyp that was removed that came back as tubular. Because he will be 18 next year when he is due for his next colonoscopy he wants to know if he still needs to have that scheduled next year or not. Please see what they would recommend based on the most current guidelines.  Beatrice Lecher, MD

## 2015-06-02 LAB — PSA: PSA: 2.42 ng/mL (ref <=4.00)

## 2015-06-02 NOTE — Progress Notes (Signed)
Quick Note:  All labs are normal. ______ 

## 2015-06-03 NOTE — Telephone Encounter (Signed)
Per GI patient will get a letter stating whether or not he will need a colonoscopy.  Patient aware.

## 2015-08-18 DIAGNOSIS — R69 Illness, unspecified: Secondary | ICD-10-CM | POA: Diagnosis not present

## 2015-08-19 DIAGNOSIS — H524 Presbyopia: Secondary | ICD-10-CM | POA: Diagnosis not present

## 2015-08-19 DIAGNOSIS — H40011 Open angle with borderline findings, low risk, right eye: Secondary | ICD-10-CM | POA: Diagnosis not present

## 2015-10-26 DIAGNOSIS — D2261 Melanocytic nevi of right upper limb, including shoulder: Secondary | ICD-10-CM | POA: Diagnosis not present

## 2015-10-26 DIAGNOSIS — D224 Melanocytic nevi of scalp and neck: Secondary | ICD-10-CM | POA: Diagnosis not present

## 2015-10-26 DIAGNOSIS — D2262 Melanocytic nevi of left upper limb, including shoulder: Secondary | ICD-10-CM | POA: Diagnosis not present

## 2015-10-26 DIAGNOSIS — D225 Melanocytic nevi of trunk: Secondary | ICD-10-CM | POA: Diagnosis not present

## 2015-10-26 DIAGNOSIS — D2230 Melanocytic nevi of unspecified part of face: Secondary | ICD-10-CM | POA: Diagnosis not present

## 2015-10-26 DIAGNOSIS — D220 Melanocytic nevi of lip: Secondary | ICD-10-CM | POA: Diagnosis not present

## 2015-10-26 DIAGNOSIS — D2272 Melanocytic nevi of left lower limb, including hip: Secondary | ICD-10-CM | POA: Diagnosis not present

## 2015-10-26 DIAGNOSIS — L821 Other seborrheic keratosis: Secondary | ICD-10-CM | POA: Diagnosis not present

## 2015-10-26 DIAGNOSIS — L570 Actinic keratosis: Secondary | ICD-10-CM | POA: Diagnosis not present

## 2015-11-30 ENCOUNTER — Ambulatory Visit: Payer: Medicare HMO | Admitting: Family Medicine

## 2016-01-02 DIAGNOSIS — W231XXA Caught, crushed, jammed, or pinched between stationary objects, initial encounter: Secondary | ICD-10-CM | POA: Diagnosis not present

## 2016-01-02 DIAGNOSIS — S61215A Laceration without foreign body of left ring finger without damage to nail, initial encounter: Secondary | ICD-10-CM | POA: Diagnosis not present

## 2016-01-02 DIAGNOSIS — R600 Localized edema: Secondary | ICD-10-CM | POA: Diagnosis not present

## 2016-01-02 DIAGNOSIS — S63634A Sprain of interphalangeal joint of right ring finger, initial encounter: Secondary | ICD-10-CM | POA: Diagnosis not present

## 2016-01-02 DIAGNOSIS — S63614A Unspecified sprain of right ring finger, initial encounter: Secondary | ICD-10-CM | POA: Diagnosis not present

## 2016-01-02 DIAGNOSIS — W230XXA Caught, crushed, jammed, or pinched between moving objects, initial encounter: Secondary | ICD-10-CM | POA: Diagnosis not present

## 2016-01-05 ENCOUNTER — Ambulatory Visit (INDEPENDENT_AMBULATORY_CARE_PROVIDER_SITE_OTHER): Payer: Medicare HMO | Admitting: Family Medicine

## 2016-01-05 ENCOUNTER — Encounter: Payer: Self-pay | Admitting: Family Medicine

## 2016-01-05 VITALS — BP 120/73 | HR 57 | Wt 168.0 lb

## 2016-01-05 DIAGNOSIS — I10 Essential (primary) hypertension: Secondary | ICD-10-CM | POA: Diagnosis not present

## 2016-01-05 DIAGNOSIS — R748 Abnormal levels of other serum enzymes: Secondary | ICD-10-CM

## 2016-01-05 DIAGNOSIS — E785 Hyperlipidemia, unspecified: Secondary | ICD-10-CM

## 2016-01-05 DIAGNOSIS — R7989 Other specified abnormal findings of blood chemistry: Secondary | ICD-10-CM

## 2016-01-05 MED ORDER — LOVASTATIN 40 MG PO TABS: ORAL_TABLET | ORAL | Status: DC

## 2016-01-05 NOTE — Progress Notes (Signed)
Subjective:    CC: HTN  HPI: Hypertension- Pt denies chest pain, SOB, dizziness, or heart palpitations.  Taking meds as directed w/o problems.  Denies medication side effects.    Hyperlipidemia-tolerating statin well without any side effects or problems. Due for refills. Lipid levels checked about 6 months ago.  Past medical history, Surgical history, Family history not pertinant except as noted below, Social history, Allergies, and medications have been entered into the medical record, reviewed, and corrections made.   Review of Systems: No fevers, chills, night sweats, weight loss, chest pain, or shortness of breath.   Objective:    General: Well Developed, well nourished, and in no acute distress.  Neuro: Alert and oriented x3, extra-ocular muscles intact, sensation grossly intact.  HEENT: Normocephalic, atraumatic  Skin: Warm and dry, no rashes. Cardiac: Regular rate and rhythm, no murmurs rubs or gallops, no lower extremity edema.  Respiratory: Clear to auscultation bilaterally. Not using accessory muscles, speaking in full sentences.   Impression and Recommendations:    HTN - Well controlled. Continue current regimen. Follow up in 6 months. Due for BMP today.    Hyperlipidemia-continue statin daily. Refill for one year. Due for repeat lipids in 6 months.

## 2016-01-06 LAB — BASIC METABOLIC PANEL WITH GFR
BUN: 24 mg/dL (ref 7–25)
CALCIUM: 8.9 mg/dL (ref 8.6–10.3)
CO2: 29 mmol/L (ref 20–31)
CREATININE: 1.15 mg/dL — AB (ref 0.70–1.11)
Chloride: 103 mmol/L (ref 98–110)
GFR, EST AFRICAN AMERICAN: 69 mL/min (ref >=60)
GFR, EST NON AFRICAN AMERICAN: 59 mL/min — AB (ref >=60)
Glucose, Bld: 84 mg/dL (ref 65–99)
Potassium: 4 mmol/L (ref 3.5–5.3)
SODIUM: 139 mmol/L (ref 135–146)

## 2016-01-06 NOTE — Addendum Note (Signed)
Addended by: Teddy Spike on: 01/06/2016 08:43 AM   Modules accepted: Orders, SmartSet

## 2016-01-07 ENCOUNTER — Other Ambulatory Visit: Payer: Self-pay | Admitting: Family Medicine

## 2016-01-19 DIAGNOSIS — R748 Abnormal levels of other serum enzymes: Secondary | ICD-10-CM | POA: Diagnosis not present

## 2016-01-19 DIAGNOSIS — I10 Essential (primary) hypertension: Secondary | ICD-10-CM | POA: Diagnosis not present

## 2016-01-20 LAB — BASIC METABOLIC PANEL
BUN: 23 mg/dL (ref 7–25)
CO2: 31 mmol/L (ref 20–31)
CREATININE: 0.97 mg/dL (ref 0.70–1.11)
Calcium: 9.3 mg/dL (ref 8.6–10.3)
Chloride: 106 mmol/L (ref 98–110)
Glucose, Bld: 61 mg/dL — ABNORMAL LOW (ref 65–99)
POTASSIUM: 4.5 mmol/L (ref 3.5–5.3)
Sodium: 143 mmol/L (ref 135–146)

## 2016-01-20 LAB — MICROALBUMIN, URINE: MICROALB UR: 0.8 mg/dL

## 2016-01-20 NOTE — Progress Notes (Signed)
All labs are normal. 

## 2016-02-15 ENCOUNTER — Other Ambulatory Visit: Payer: Self-pay | Admitting: Family Medicine

## 2016-04-02 DIAGNOSIS — Z23 Encounter for immunization: Secondary | ICD-10-CM | POA: Diagnosis not present

## 2016-06-06 ENCOUNTER — Encounter: Payer: Self-pay | Admitting: Family Medicine

## 2016-06-06 ENCOUNTER — Ambulatory Visit (INDEPENDENT_AMBULATORY_CARE_PROVIDER_SITE_OTHER): Payer: Medicare HMO | Admitting: Family Medicine

## 2016-06-06 VITALS — BP 127/68 | HR 54 | Ht 70.0 in | Wt 168.0 lb

## 2016-06-06 DIAGNOSIS — Z0000 Encounter for general adult medical examination without abnormal findings: Secondary | ICD-10-CM

## 2016-06-06 DIAGNOSIS — Z125 Encounter for screening for malignant neoplasm of prostate: Secondary | ICD-10-CM | POA: Diagnosis not present

## 2016-06-06 DIAGNOSIS — I10 Essential (primary) hypertension: Secondary | ICD-10-CM | POA: Diagnosis not present

## 2016-06-06 LAB — COMPLETE METABOLIC PANEL WITH GFR
ALK PHOS: 54 U/L (ref 40–115)
ALT: 10 U/L (ref 9–46)
AST: 26 U/L (ref 10–35)
Albumin: 4.1 g/dL (ref 3.6–5.1)
BILIRUBIN TOTAL: 0.5 mg/dL (ref 0.2–1.2)
BUN: 18 mg/dL (ref 7–25)
CO2: 31 mmol/L (ref 20–31)
Calcium: 9.1 mg/dL (ref 8.6–10.3)
Chloride: 105 mmol/L (ref 98–110)
Creat: 1.03 mg/dL (ref 0.70–1.11)
GFR, EST AFRICAN AMERICAN: 78 mL/min (ref >=60)
GFR, EST NON AFRICAN AMERICAN: 68 mL/min (ref >=60)
GLUCOSE: 88 mg/dL (ref 65–99)
POTASSIUM: 4.1 mmol/L (ref 3.5–5.3)
SODIUM: 140 mmol/L (ref 135–146)
TOTAL PROTEIN: 7.2 g/dL (ref 6.1–8.1)

## 2016-06-06 LAB — LIPID PANEL
CHOL/HDL RATIO: 3 ratio (ref <5.0)
Cholesterol: 149 mg/dL (ref <200)
HDL: 50 mg/dL (ref >40)
LDL CALC: 77 mg/dL (ref <100)
TRIGLYCERIDES: 109 mg/dL (ref <150)
VLDL: 22 mg/dL (ref <30)

## 2016-06-06 LAB — PSA: PSA: 2 ng/mL (ref <=4.0)

## 2016-06-06 NOTE — Patient Instructions (Addendum)

## 2016-06-06 NOTE — Progress Notes (Signed)
Subjective:   Patrick Perry is a 82 y.o. male who presents for Medicare Annual/Subsequent preventive examination.  Review of Systems:  Hypertensive review of systems is negative.       Objective:    Vitals: BP 127/68   Pulse (!) 54   Ht 5\' 10"  (1.778 m)   Wt 168 lb (76.2 kg)   SpO2 98%   BMI 24.11 kg/m   Body mass index is 24.11 kg/m.  Physical Exam  Constitutional: He is oriented to person, place, and time. He appears well-developed and well-nourished.  HENT:  Head: Normocephalic and atraumatic.  Right Ear: External ear normal.  Left Ear: External ear normal.  Nose: Nose normal.  Mouth/Throat: Oropharynx is clear and moist.  TMs and canals are clear.   Eyes: Conjunctivae and EOM are normal. Pupils are equal, round, and reactive to light.  Neck: Neck supple. No thyromegaly present.  Cardiovascular: Normal rate, regular rhythm and normal heart sounds.   Pulmonary/Chest: Effort normal and breath sounds normal.  Abdominal: Soft. Bowel sounds are normal. He exhibits no distension. There is no tenderness.  Musculoskeletal: He exhibits no edema.  Lymphadenopathy:    He has no cervical adenopathy.  Neurological: He is alert and oriented to person, place, and time.  Skin: Skin is warm and dry.  Psychiatric: He has a normal mood and affect. His behavior is normal.     Tobacco History  Smoking Status  . Never Smoker  Smokeless Tobacco  . Not on file     Counseling given: Not Answered   Past Medical History:  Diagnosis Date  . Cataract   . H/O colonoscopy with polypectomy 06/12/11   repeat in 5 years   . History of tonsillectomy 1946   Past Surgical History:  Procedure Laterality Date  . BUNIONECTOMY  10/07/2012   Hammer toe  . CATARACT EXTRACTION, BILATERAL  2009  . HEMORRHOID SURGERY    . HERNIA REPAIR    . REPAIR KNEE LIGAMENT  1978  . ROTATOR CUFF REPAIR  2008   left  . TONSILLECTOMY  1946   Family History  Problem Relation Age of Onset  .  Prostate cancer Brother    History  Sexual Activity  . Sexual activity: Yes    Outpatient Encounter Prescriptions as of 06/06/2016  Medication Sig  . aspirin 81 MG tablet Take 81 mg by mouth daily.  Marland Kitchen b complex vitamins capsule Take 1 capsule by mouth daily.  . Cholecalciferol (VITAMIN D-3) 1000 UNITS CAPS Take by mouth 2 (two) times daily.  . Coenzyme Q10 (CO Q-10) 100 MG CAPS Take 1 capsule by mouth daily.  . hydrochlorothiazide (HYDRODIURIL) 25 MG tablet take 1/2 tablet by mouth once daily  . lovastatin (MEVACOR) 40 MG tablet take 1 tablet by mouth at bedtime  . Misc Natural Products (PROSTATE) CAPS Take 1 tablet by mouth 2 (two) times daily.  . vitamin C (ASCORBIC ACID) 500 MG tablet Take 500 mg by mouth daily.   No facility-administered encounter medications on file as of 06/06/2016.     Activities of Daily Living In your present state of health, do you have any difficulty performing the following activities: 06/06/2016  Hearing? Y  Vision? N  Difficulty concentrating or making decisions? N  Walking or climbing stairs? N  Dressing or bathing? N  Doing errands, shopping? N  Some recent data might be hidden    Patient Care Team: Hali Marry, MD as PCP - General (Family Medicine) Amy V  Jodi Mourning, OD (Optometry) Jari Pigg, MD as Attending Physician (Dermatology)   Assessment:    Medicare Wellness Exam   Exercise Activities and Dietary recommendations Current Exercise Habits: Home exercise routine, Type of exercise: treadmill, Time (Minutes): 60, Frequency (Times/Week): 3, Weekly Exercise (Minutes/Week): 180  Goals    None     Fall Risk Fall Risk  01/05/2016 01/05/2016 05/25/2014 05/18/2013  Falls in the past year? No No No No   Depression Screen PHQ 2/9 Scores 01/05/2016 01/05/2016 05/25/2014 05/18/2013  PHQ - 2 Score 0 0 0 0    Cognitive Function     6CIT Screen 06/06/2016  What Year? 0 points  What month? 0 points  What time? 0 points  Count back  from 20 0 points  Months in reverse 0 points  Repeat phrase 0 points  Total Score 0    Immunization History  Administered Date(s) Administered  . Influenza Split 03/19/2012  . Influenza, High Dose Seasonal PF 04/21/2013  . Influenza,inj,Quad PF,36+ Mos 04/07/2015  . Influenza-Unspecified 04/02/2016  . Pneumococcal Conjugate-13 05/25/2014  . Pneumococcal Polysaccharide-23 04/30/2006  . Tdap 05/01/2010  . Zoster 04/03/2006   Screening Tests Health Maintenance  Topic Date Due  . COLONOSCOPY  06/11/2016  . TETANUS/TDAP  05/01/2020  . INFLUENZA VACCINE  Completed  . ZOSTAVAX  Completed  . PNA vac Low Risk Adult  Completed      Plan:    During the course of the visit the patient was educated and counseled about the following appropriate screening and preventive services:   Vaccines to include Pneumoccal, Influenza, Hepatitis B, Td, Zostavax, HCV  Cardiovascular Disease  Diabetes screening  Prostate Cancer Screening  Glaucoma screening  Nutrition counseling   Smoking cessation counseling  Patient Instructions (the written plan) was given to the patient.    Taylyn Brame, MD  06/06/2016

## 2016-06-08 NOTE — Progress Notes (Signed)
All labs are normal. 

## 2016-07-03 DIAGNOSIS — R69 Illness, unspecified: Secondary | ICD-10-CM | POA: Diagnosis not present

## 2016-07-09 ENCOUNTER — Encounter: Payer: Medicare HMO | Admitting: Family Medicine

## 2016-08-31 DIAGNOSIS — H1045 Other chronic allergic conjunctivitis: Secondary | ICD-10-CM | POA: Diagnosis not present

## 2016-08-31 DIAGNOSIS — Z0100 Encounter for examination of eyes and vision without abnormal findings: Secondary | ICD-10-CM | POA: Diagnosis not present

## 2016-08-31 DIAGNOSIS — H40011 Open angle with borderline findings, low risk, right eye: Secondary | ICD-10-CM | POA: Diagnosis not present

## 2016-08-31 DIAGNOSIS — H1851 Endothelial corneal dystrophy: Secondary | ICD-10-CM | POA: Diagnosis not present

## 2016-08-31 DIAGNOSIS — Z961 Presence of intraocular lens: Secondary | ICD-10-CM | POA: Diagnosis not present

## 2016-08-31 DIAGNOSIS — H33303 Unspecified retinal break, bilateral: Secondary | ICD-10-CM | POA: Diagnosis not present

## 2016-08-31 DIAGNOSIS — H524 Presbyopia: Secondary | ICD-10-CM | POA: Diagnosis not present

## 2016-08-31 DIAGNOSIS — H52223 Regular astigmatism, bilateral: Secondary | ICD-10-CM | POA: Diagnosis not present

## 2016-11-16 DIAGNOSIS — D2230 Melanocytic nevi of unspecified part of face: Secondary | ICD-10-CM | POA: Diagnosis not present

## 2016-11-16 DIAGNOSIS — L723 Sebaceous cyst: Secondary | ICD-10-CM | POA: Diagnosis not present

## 2016-11-16 DIAGNOSIS — D2261 Melanocytic nevi of right upper limb, including shoulder: Secondary | ICD-10-CM | POA: Diagnosis not present

## 2016-11-16 DIAGNOSIS — D2262 Melanocytic nevi of left upper limb, including shoulder: Secondary | ICD-10-CM | POA: Diagnosis not present

## 2016-11-16 DIAGNOSIS — L821 Other seborrheic keratosis: Secondary | ICD-10-CM | POA: Diagnosis not present

## 2016-11-16 DIAGNOSIS — D2272 Melanocytic nevi of left lower limb, including hip: Secondary | ICD-10-CM | POA: Diagnosis not present

## 2016-11-16 DIAGNOSIS — D224 Melanocytic nevi of scalp and neck: Secondary | ICD-10-CM | POA: Diagnosis not present

## 2016-11-22 ENCOUNTER — Other Ambulatory Visit: Payer: Self-pay | Admitting: *Deleted

## 2016-11-22 MED ORDER — LOVASTATIN 40 MG PO TABS: ORAL_TABLET | ORAL | 3 refills | Status: DC

## 2016-12-05 ENCOUNTER — Ambulatory Visit (INDEPENDENT_AMBULATORY_CARE_PROVIDER_SITE_OTHER): Payer: Medicare HMO | Admitting: Family Medicine

## 2016-12-05 ENCOUNTER — Encounter: Payer: Self-pay | Admitting: Family Medicine

## 2016-12-05 ENCOUNTER — Telehealth: Payer: Self-pay | Admitting: Family Medicine

## 2016-12-05 VITALS — BP 129/61 | HR 54 | Ht 66.73 in | Wt 165.0 lb

## 2016-12-05 DIAGNOSIS — I10 Essential (primary) hypertension: Secondary | ICD-10-CM

## 2016-12-05 DIAGNOSIS — H9193 Unspecified hearing loss, bilateral: Secondary | ICD-10-CM | POA: Diagnosis not present

## 2016-12-05 LAB — BASIC METABOLIC PANEL WITH GFR
BUN: 25 mg/dL (ref 7–25)
CHLORIDE: 104 mmol/L (ref 98–110)
CO2: 29 mmol/L (ref 20–31)
CREATININE: 0.93 mg/dL (ref 0.70–1.11)
Calcium: 9.2 mg/dL (ref 8.6–10.3)
GFR, EST NON AFRICAN AMERICAN: 76 mL/min (ref >=60)
GFR, Est African American: 88 mL/min (ref >=60)
Glucose, Bld: 73 mg/dL (ref 65–99)
POTASSIUM: 4 mmol/L (ref 3.5–5.3)
SODIUM: 142 mmol/L (ref 135–146)

## 2016-12-05 NOTE — Telephone Encounter (Signed)
Please call patient and let him know that we did call [facility]. They said based on his last findings and his age he actually does not need another colonoscopy at this point unless he starts having problems.  Beatrice Lecher, MD

## 2016-12-05 NOTE — Progress Notes (Signed)
Subjective:    CC: HTN  HPI:  Hypertension- Pt denies chest pain, SOB, dizziness, or heart palpitations.  Taking meds as directed w/o problems.  Denies medication side effects.    He also complains of bilateral hearing loss. He notices he tends to use his phone more on his left than on his right. He says his wife is more concerned than he is. He says that it has been more gradual. He is noticing that he is having a hard time hearing people to speak quickly or he tended to in his mind mumble. He does have some intermittent seasonal allergy symptoms but says they're really not severe. He hasn't even been taking any medication for it. He denies any ear ringing. He does clean his ear with with Q-tips.  Past medical history, Surgical history, Family history not pertinant except as noted below, Social history, Allergies, and medications have been entered into the medical record, reviewed, and corrections made.   Review of Systems: No fevers, chills, night sweats, weight loss, chest pain, or shortness of breath.   Objective:    General: Well Developed, well nourished, and in no acute distress.  Neuro: Alert and oriented x3, extra-ocular muscles intact, sensation grossly intact.  HEENT: Normocephalic, atraumatic, TMs and canals are clear bilaterally. He is a little bit of thickening of the TM on the right side. Skin: Warm and dry, no rashes. Cardiac: Regular rate and rhythm, no murmurs rubs or gallops, no lower extremity edema.  Respiratory: Clear to auscultation bilaterally. Not using accessory muscles, speaking in full sentences.   Impression and Recommendations:    HTN - Well controlled. Continue current regimen. Follow up in  6 months. Due for BMP.    Bilateral hearing loss-will refer for formal audiology since your exam is normal today. He can see if he might be a candidate for hearing aids.  Per our records he is due for colonoscopy. Will contact [facility] just to make sure. He says he has  not heard from their office to schedule.

## 2016-12-06 NOTE — Progress Notes (Signed)
All labs are normal. 

## 2016-12-10 NOTE — Telephone Encounter (Signed)
Pt advised of recommendations.Patrick Perry  

## 2017-01-14 DIAGNOSIS — H903 Sensorineural hearing loss, bilateral: Secondary | ICD-10-CM | POA: Diagnosis not present

## 2017-03-26 DIAGNOSIS — R69 Illness, unspecified: Secondary | ICD-10-CM | POA: Diagnosis not present

## 2017-04-02 DIAGNOSIS — R69 Illness, unspecified: Secondary | ICD-10-CM | POA: Diagnosis not present

## 2017-05-11 ENCOUNTER — Other Ambulatory Visit: Payer: Self-pay | Admitting: Family Medicine

## 2017-06-10 ENCOUNTER — Ambulatory Visit (INDEPENDENT_AMBULATORY_CARE_PROVIDER_SITE_OTHER): Payer: Medicare HMO | Admitting: Family Medicine

## 2017-06-10 ENCOUNTER — Encounter: Payer: Self-pay | Admitting: Family Medicine

## 2017-06-10 VITALS — BP 122/70 | HR 62 | Ht 69.0 in | Wt 165.0 lb

## 2017-06-10 DIAGNOSIS — Z0000 Encounter for general adult medical examination without abnormal findings: Secondary | ICD-10-CM

## 2017-06-10 DIAGNOSIS — I10 Essential (primary) hypertension: Secondary | ICD-10-CM | POA: Diagnosis not present

## 2017-06-10 DIAGNOSIS — Z125 Encounter for screening for malignant neoplasm of prostate: Secondary | ICD-10-CM

## 2017-06-10 LAB — COMPLETE METABOLIC PANEL WITH GFR
AG Ratio: 1.3 (calc) (ref 1.0–2.5)
ALBUMIN MSPROF: 4 g/dL (ref 3.6–5.1)
ALT: 10 U/L (ref 9–46)
AST: 28 U/L (ref 10–35)
Alkaline phosphatase (APISO): 56 U/L (ref 40–115)
BUN: 15 mg/dL (ref 7–25)
CHLORIDE: 104 mmol/L (ref 98–110)
CO2: 32 mmol/L (ref 20–32)
CREATININE: 1 mg/dL (ref 0.70–1.11)
Calcium: 9.4 mg/dL (ref 8.6–10.3)
GFR, EST AFRICAN AMERICAN: 81 mL/min/{1.73_m2} (ref >=60)
GFR, Est Non African American: 70 mL/min/{1.73_m2} (ref >=60)
GLOBULIN: 3.2 g/dL (ref 1.9–3.7)
GLUCOSE: 89 mg/dL (ref 65–99)
Potassium: 4 mmol/L (ref 3.5–5.3)
SODIUM: 140 mmol/L (ref 135–146)
TOTAL PROTEIN: 7.2 g/dL (ref 6.1–8.1)
Total Bilirubin: 0.4 mg/dL (ref 0.2–1.2)

## 2017-06-10 LAB — LIPID PANEL
CHOLESTEROL: 151 mg/dL (ref <200)
HDL: 53 mg/dL (ref >40)
LDL Cholesterol (Calc): 80 mg/dL (calc)
Non-HDL Cholesterol (Calc): 98 mg/dL (calc) (ref <130)
TRIGLYCERIDES: 98 mg/dL (ref <150)
Total CHOL/HDL Ratio: 2.8 (calc) (ref <5.0)

## 2017-06-10 MED ORDER — AMBULATORY NON FORMULARY MEDICATION: 0 refills | Status: DC

## 2017-06-10 NOTE — Progress Notes (Signed)
Subjective:   Patrick Perry is a 83 y.o. male who presents for Medicare Annual/Subsequent preventive examination.  Review of Systems:  Comprehensive ROS is negative.   Cardiac Risk Factors include: none     Objective:    Vitals: BP 122/70   Pulse 62   Ht 5\' 9"  (1.753 m)   Wt 165 lb (74.8 kg)   BMI 24.37 kg/m   Body mass index is 24.37 kg/m.  Advanced Directives 06/10/2017 05/25/2014  Does Patient Have a Medical Advance Directive? Yes Yes  Type of Paramedic of [place];Living will Living will;Healthcare Power of Attorney  Does patient want to make changes to medical advance directive? - No - Patient declined  Copy of [place] in Chart? - No - copy requested    Tobacco Social History   Tobacco Use  Smoking Status Never Smoker  Smokeless Tobacco Never Used     Counseling given: Not Answered   Clinical Intake:  He gets his yearly eye exam done through [facility] which is now [facility].  He is interested in the new shingles vaccine.    Physical Exam  Constitutional: He is oriented to person, place, and time. He appears well-developed and well-nourished.  HENT:  Head: Normocephalic and atraumatic.  Right Ear: External ear normal.  Left Ear: External ear normal.  Nose: Nose normal.  Mouth/Throat: Oropharynx is clear and moist.  Eyes: Conjunctivae and EOM are normal. Pupils are equal, round, and reactive to light.  Neck: Normal range of motion. Neck supple. No thyromegaly present.  Cardiovascular: Normal rate, regular rhythm, normal heart sounds and intact distal pulses.  Pulmonary/Chest: Effort normal and breath sounds normal.  Abdominal: Soft. Bowel sounds are normal. He exhibits no distension and no mass. There is no tenderness. There is no rebound and no guarding.  Musculoskeletal: Normal range of motion.  Lymphadenopathy:    He has no cervical adenopathy.  Neurological: He is alert and oriented to  person, place, and time. He has normal reflexes.  Skin: Skin is warm and dry.  Psychiatric: He has a normal mood and affect. His behavior is normal. Judgment and thought content normal.          Past Medical History:  Diagnosis Date  . Cataract   . H/O colonoscopy with polypectomy 06/12/11   repeat in 5 years   . History of tonsillectomy 1946   Past Surgical History:  Procedure Laterality Date  . BUNIONECTOMY  10/07/2012   Hammer toe  . CATARACT EXTRACTION, BILATERAL  2009  . HEMORRHOID SURGERY    . HERNIA REPAIR    . REPAIR KNEE LIGAMENT  1978  . ROTATOR CUFF REPAIR  2008   left  . TONSILLECTOMY  1946   Family History  Problem Relation Age of Onset  . Prostate cancer Brother    Social History   Socioeconomic History  . Marital status: Married    Spouse name: [name]  . Number of children: 2  . Years of education: BA  . Highest education level: None  Social Needs  . Financial resource strain: None  . Food insecurity - worry: None  . Food insecurity - inability: None  . Transportation needs - medical: None  . Transportation needs - non-medical: None  Occupational History  . Occupation: Retired.  Tobacco Use  . Smoking status: Never Smoker  . Smokeless tobacco: Never Used  Substance and Sexual Activity  . Alcohol use: No  . Drug use: No  .  Sexual activity: Yes  Other Topics Concern  . None  Social History Narrative   Works out 3 x per week, treadmill, home gym. No caffeine. [employer]    Outpatient Encounter Medications as of 06/10/2017  Medication Sig  . aspirin 81 MG tablet Take 81 mg by mouth daily.  Marland Kitchen b complex vitamins capsule Take 1 capsule by mouth daily.  . Cholecalciferol (VITAMIN D-3) 1000 UNITS CAPS Take by mouth 2 (two) times daily.  . Coenzyme Q10 (CO Q-10) 100 MG CAPS Take 1 capsule by mouth daily.  Marland Kitchen GLUCOSAMINE-CHONDROITIN-CA-D3 PO Take 2,000 mg by mouth.  . hydrochlorothiazide (HYDRODIURIL) 25 MG tablet TAKE 1/2 TABLET ONCE DAILY   . lovastatin (MEVACOR) 40 MG tablet take 1 tablet by mouth at bedtime  . Misc Natural Products (PROSTATE) CAPS Take 1 tablet by mouth 2 (two) times daily.  . vitamin C (ASCORBIC ACID) 500 MG tablet Take 500 mg by mouth daily.  . AMBULATORY NON FORMULARY MEDICATION Medication Name: Singrix IM x 1 repeat dose in 2-6 months.   No facility-administered encounter medications on file as of 06/10/2017.     Activities of Daily Living In your present state of health, do you have any difficulty performing the following activities: 06/10/2017 06/10/2017  Hearing? N N  Comment - wears hearing aids  Vision? N N  Difficulty concentrating or making decisions? N N  Walking or climbing stairs? N N  Dressing or bathing? N N  Doing errands, shopping? N N  Preparing Food and eating ? N -  Using the Toilet? N -  In the past six months, have you accidently leaked urine? N -  Do you have problems with loss of bowel control? N -  Managing your Medications? N -  Managing your Finances? N -  Housekeeping or managing your Housekeeping? N -  Some recent data might be hidden    Patient Care Team: Hali Marry, MD as PCP - General (Family Medicine) Renelda Loma, [place] (Optometry) Jari Pigg, MD as Attending Physician (Dermatology)   Assessment:   This is a routine wellness examination for Blair.  Exercise Activities and Dietary recommendations Current Exercise Habits: Home exercise routine, Type of exercise: treadmill, Time (Minutes): 30, Frequency (Times/Week): 3, Weekly Exercise (Minutes/Week): 90, Intensity: Moderate  Goals    None      Fall Risk Fall Risk  06/10/2017 12/05/2016 01/05/2016 01/05/2016 05/25/2014  Falls in the past year? No No No No No    Depression Screen PHQ 2/9 Scores 06/10/2017 06/10/2017 12/05/2016 01/05/2016  PHQ - 2 Score 0 0 0 0    Cognitive Function     6CIT Screen 06/10/2017 06/06/2016  What Year? 0 points 0 points  What month? 0 points 0 points  What  time? 0 points 0 points  Count back from 20 0 points 0 points  Months in reverse 0 points 0 points  Repeat phrase 0 points 0 points  Total Score 0 0    Immunization History  Administered Date(s) Administered  . Influenza Split 03/19/2012  . Influenza, High Dose Seasonal PF 04/21/2013, 03/26/2017  . Influenza,inj,Quad PF,6+ Mos 04/07/2015  . Influenza-Unspecified 04/02/2016  . Pneumococcal Conjugate-13 05/25/2014  . Pneumococcal Polysaccharide-23 04/30/2006  . Tdap 05/01/2010  . Zoster 04/03/2006    Qualifies for Shingles Vaccine? Yes, handout given.   Screening Tests Health Maintenance  Topic Date Due  . TETANUS/TDAP  05/01/2020  . INFLUENZA VACCINE  Completed  . PNA vac Low Risk Adult  Completed  Cancer Screenings: Lung: Low Dose CT Chest recommended if Age 43-80 years, 30 pack-year currently smoking OR have quit w/in 15years. Patient does not qualify. Colorectal: up to date.    Additional Screenings: N/A Hepatitis B/HIV/Syphillis: Hepatitis C Screening:     Plan:    Medicare WEllness Exam    I have personally reviewed and noted the following in the patient's chart:   . Medical and social history . Use of alcohol, tobacco or illicit drugs  . Current medications and supplements - updated.  . Functional ability and status - Excellent . Nutritional status . Physical activity - 3 days per week.  . Advanced directives . List of other physicians  - updated.   Marland Kitchen Hospitalizations, surgeries, and ER visits in previous 12 months . Vitals . Screenings to include cognitive, depression, and falls . Referrals and appointments . Information on Shingrix and prescription printed for him to get this done at the pharmacy. . Declined psa testing this year after discussion.   In addition, I have reviewed and discussed with patient certain preventive protocols, quality metrics, and best practice recommendations. A written personalized care plan for preventive services as well as  general preventive health recommendations were provided to patient.     Beatrice Lecher, MD  06/10/2017

## 2017-06-10 NOTE — Patient Instructions (Addendum)

## 2017-08-10 ENCOUNTER — Emergency Department (INDEPENDENT_AMBULATORY_CARE_PROVIDER_SITE_OTHER)
Admission: EM | Admit: 2017-08-10 | Discharge: 2017-08-10 | Disposition: A | Discharge status: Other Acute Inpatient Hospital | Payer: Medicare HMO | Source: Home / Self Care | Attending: Family Medicine | Admitting: Family Medicine

## 2017-08-10 ENCOUNTER — Other Ambulatory Visit: Payer: Self-pay

## 2017-08-10 ENCOUNTER — Emergency Department (HOSPITAL_BASED_OUTPATIENT_CLINIC_OR_DEPARTMENT_OTHER)
Admission: EM | Admit: 2017-08-10 | Discharge: 2017-08-10 | Disposition: A | Discharge status: Home / Self Care | Payer: Medicare HMO | Attending: Emergency Medicine | Admitting: Emergency Medicine

## 2017-08-10 ENCOUNTER — Encounter: Payer: Self-pay | Admitting: Emergency Medicine

## 2017-08-10 ENCOUNTER — Encounter (HOSPITAL_BASED_OUTPATIENT_CLINIC_OR_DEPARTMENT_OTHER): Payer: Self-pay | Admitting: Emergency Medicine

## 2017-08-10 DIAGNOSIS — S0083XA Contusion of other part of head, initial encounter: Secondary | ICD-10-CM

## 2017-08-10 DIAGNOSIS — Y929 Unspecified place or not applicable: Secondary | ICD-10-CM | POA: Diagnosis not present

## 2017-08-10 DIAGNOSIS — S0012XA Contusion of left eyelid and periocular area, initial encounter: Secondary | ICD-10-CM | POA: Insufficient documentation

## 2017-08-10 DIAGNOSIS — I10 Essential (primary) hypertension: Secondary | ICD-10-CM | POA: Insufficient documentation

## 2017-08-10 DIAGNOSIS — S0081XA Abrasion of other part of head, initial encounter: Secondary | ICD-10-CM | POA: Insufficient documentation

## 2017-08-10 DIAGNOSIS — Y999 Unspecified external cause status: Secondary | ICD-10-CM | POA: Diagnosis not present

## 2017-08-10 DIAGNOSIS — T148XXA Other injury of unspecified body region, initial encounter: Secondary | ICD-10-CM

## 2017-08-10 DIAGNOSIS — Y939 Activity, unspecified: Secondary | ICD-10-CM | POA: Insufficient documentation

## 2017-08-10 DIAGNOSIS — Z7982 Long term (current) use of aspirin: Secondary | ICD-10-CM | POA: Insufficient documentation

## 2017-08-10 DIAGNOSIS — W1831XA Fall on same level due to stepping on an object, initial encounter: Secondary | ICD-10-CM | POA: Diagnosis not present

## 2017-08-10 DIAGNOSIS — S0512XA Contusion of eyeball and orbital tissues, left eye, initial encounter: Secondary | ICD-10-CM | POA: Diagnosis not present

## 2017-08-10 DIAGNOSIS — S0990XA Unspecified injury of head, initial encounter: Secondary | ICD-10-CM | POA: Diagnosis not present

## 2017-08-10 DIAGNOSIS — S098XXA Other specified injuries of head, initial encounter: Secondary | ICD-10-CM | POA: Diagnosis present

## 2017-08-10 DIAGNOSIS — Z79899 Other long term (current) drug therapy: Secondary | ICD-10-CM | POA: Insufficient documentation

## 2017-08-10 NOTE — ED Triage Notes (Signed)
Pt sent from UC. Pt fell in driveway, landing face first on cement, on Thursday. Pt reports he initially had swelling and bruising to L eye but woke up today with swelling and bruising to R eye also. Denies visual changes, dizziness. Denies LOC with the fall.

## 2017-08-10 NOTE — ED Provider Notes (Signed)
[facility] EMERGENCY DEPARTMENT Provider Note  CSN: [id number] Arrival date & time: 08/10/17 1022  Chief Complaint(s) Fall  HPI Patrick Perry is a 84 y.o. male here for evaluation after a mechanical fall resulting in head trauma that occurred 2 days ago.  Patient reports stepping on object, twisting his ankle causing him to fall onto his left knee and hitting left side of his face.  He denies any loss of consciousness or amnesia to the event.  He is endorsing abrasions to the left knee and face with left periorbital ecchymosis that began immediately after the fall.  Patient endorsed mild epistaxis following the fall that has now resolved.  Denies any clear fluid rhinorrhea.  He denies any associated headache, dizziness, visual changes, focal weakness, neck pain, back pain, chest pain, abdominal pain, extremity pain.  This morning when he awoke he noticed ecchymosis of the right periorbital region which prompted him to be evaluated.  He presented to urgent care who recommended he be seen here in the emergency department for further evaluation.   Patient reports that he is slept on his right side due to discomfort while sleeping on the left.  HPI  Past Medical History Past Medical History:  Diagnosis Date  . Cataract   . H/O colonoscopy with polypectomy 06/12/11   repeat in 5 years   . History of tonsillectomy 1946   Patient Active Problem List   Diagnosis Date Noted  . Acquired deformity of right toe 11/17/2013  . Bilateral inguinal hernia 09/16/2012  . Inguinal hernia 09/09/2012  . BPH (benign prostatic hyperplasia) 05/10/2012  . Rectal stricture 05/10/2012  . Subluxation of metatarsophalangeal joint of lesser toe of right foot 05/10/2012  . Leg length discrepancy 05/09/2012  . Essential hypertension, benign 05/09/2012  . Hyperlipidemia 05/09/2012   Home Medication(s) Prior to Admission medications   Medication Sig Start Date End Date Taking? Authorizing Provider    AMBULATORY NON FORMULARY MEDICATION Medication Name: Singrix IM x 1 repeat dose in 2-6 months. 06/10/17   Hali Marry, MD  aspirin 81 MG tablet Take 81 mg by mouth daily.    [provider]  b complex vitamins capsule Take 1 capsule by mouth daily.    [provider]  Cholecalciferol (VITAMIN D-3) 1000 UNITS CAPS Take by mouth 2 (two) times daily.    [provider]  Coenzyme Q10 (CO Q-10) 100 MG CAPS Take 1 capsule by mouth daily.    [provider]  GLUCOSAMINE-CHONDROITIN-CA-D3 PO Take 2,000 mg by mouth.    [provider]  hydrochlorothiazide (HYDRODIURIL) 25 MG tablet TAKE 1/2 TABLET ONCE DAILY 05/13/17   Hali Marry, MD  lovastatin (MEVACOR) 40 MG tablet take 1 tablet by mouth at bedtime 11/22/16   Hali Marry, MD  Misc Natural Products (PROSTATE) CAPS Take 1 tablet by mouth 2 (two) times daily.    [provider]  vitamin C (ASCORBIC ACID) 500 MG tablet Take 500 mg by mouth daily.    [provider]  Past Surgical History Past Surgical History:  Procedure Laterality Date  . BUNIONECTOMY  10/07/2012   Hammer toe  . CATARACT EXTRACTION, BILATERAL  2009  . HEMORRHOID SURGERY    . HERNIA REPAIR    . REPAIR KNEE LIGAMENT  1978  . ROTATOR CUFF REPAIR  2008   left  . TONSILLECTOMY  1946   Family History Family History  Problem Relation Age of Onset  . Prostate cancer Brother     Social History Social History   Tobacco Use  . Smoking status: Never Smoker  . Smokeless tobacco: Never Used  Substance Use Topics  . Alcohol use: No  . Drug use: No   Allergies Clindamycin/lincomycin  Review of Systems Review of Systems All other systems are reviewed and are negative for acute change except as noted in the HPI  Physical Exam Vital Signs  I have reviewed the  triage vital signs BP (!) 144/68 (BP Location: Left Arm)   Pulse 60   Temp 98 F (36.7 C) (Oral)   Resp 18   Ht 5\' 9"  (1.753 m)   Wt 74.8 kg (165 lb)   BMI 24.37 kg/m   Physical Exam  Constitutional: He is oriented to person, place, and time. He appears well-developed and well-nourished. No distress.  HENT:  Head: Normocephalic. Head is with abrasion.    Right Ear: External ear normal.  Left Ear: External ear normal.  Mouth/Throat: Oropharynx is clear and moist.  No nasal septal hematoma, no hemotympanum, no battle signs, midface stable.  Trachea midline.  Eyes: Conjunctivae and EOM are normal. Pupils are equal, round, and reactive to light. Right eye exhibits no discharge. Left eye exhibits no discharge. No scleral icterus.  Neck: Normal range of motion. Neck supple. No spinous process tenderness present.  Cardiovascular: Regular rhythm and normal heart sounds. Exam reveals no gallop and no friction rub.  No murmur heard. Pulses:      Radial pulses are 2+ on the right side, and 2+ on the left side.       Dorsalis pedis pulses are 2+ on the right side, and 2+ on the left side.  Pulmonary/Chest: Effort normal and breath sounds normal. No stridor. No respiratory distress.  Abdominal: Soft. He exhibits no distension. There is no tenderness.  Musculoskeletal:       Left knee: He exhibits no laceration and no bony tenderness. No tenderness found.       Left ankle: No tenderness.       Cervical back: He exhibits no bony tenderness.       Thoracic back: He exhibits no bony tenderness.       Lumbar back: He exhibits no bony tenderness.       Legs: Clavicle stable. Chest stable to AP/Lat compression. Pelvis stable to Lat compression. No obvious extremity deformity. No chest or abdominal wall contusion.  Neurological: He is alert and oriented to person, place, and time. GCS eye subscore is 4. GCS verbal subscore is 5. GCS motor subscore is 6.  Moving all extremities   Skin: Skin is  warm. He is not diaphoretic.    ED Results and Treatments Labs (all labs ordered are listed, but only abnormal results are displayed) Labs Reviewed - No data to display  EKG  EKG Interpretation  Date/Time:    Ventricular Rate:    PR Interval:    QRS Duration:   QT Interval:    QTC Calculation:   R Axis:     Text Interpretation:        Radiology No results found. Pertinent labs & imaging results that were available during my care of the patient were reviewed by me and considered in my medical decision making (see chart for details).  Medications Ordered in ED Medications - No data to display                                                                                                                                  Procedures Procedures  (including critical care time)  Medical Decision Making / ED Course I have reviewed the nursing notes for this encounter and the patient's prior records (if available in EHR or on provided paperwork).    Mechanical fall resulting in minor head trauma.  Patient initially had left periorbital ecchymosis.  Awoke today with right periorbital ecchymosis.  Denies any rhinorrhea.  Exam nonfocal.  Ocular movements intact and without evidence of entrapment.  No nasal septal hematoma.  No hemotympanum or battle signs.  Low suspicion for serious intracranial injury.  Right periorbital ecchymosis likely settling ecchymosis from the left.  Do not feel that imaging is necessary at this time.  The patient appears reasonably screened and/or stabilized for discharge and I doubt any other medical condition or other [facility] requiring further screening, evaluation, or treatment in the ED at this time prior to discharge.  The patient is safe for discharge with strict return precautions.   Final Clinical Impression(s) / ED Diagnoses Final  diagnoses:  Injury of head, initial encounter  Contusion of left periocular region, initial encounter  Abrasion   Disposition: Discharge  Condition: Good  I have discussed the results, Dx and Tx plan with the patient who expressed understanding and agree(s) with the plan. Discharge instructions discussed at great length. The patient was given strict return precautions who verbalized understanding of the instructions. No further questions at time of discharge.    ED Discharge Orders    None       Follow Up: Hali Marry, [place] [address] [phone]  Schedule an appointment as soon as possible for a visit  As needed      This chart was dictated using voice recognition software.  Despite best efforts to proofread,  errors can occur which can change the documentation meaning.   Fatima Blank, MD 08/10/17 [phone]

## 2017-08-10 NOTE — ED Provider Notes (Signed)
[name]    CSN: [id number] Arrival date & time: 08/10/17  4854     History   Chief Complaint Chief Complaint  Patient presents with  . Fall    HPI Patrick Perry is a 84 y.o. male.   Patient fell two days ago in his driveway, striking his face on concrete.  He denies loss of consciousness.  Initially he had facial abrasions, bruising and mild swelling about his eyes.  Last night he developed increased swelling about his eyes, with further swelling around his left eye today.  He denies headache, vision changes, or neurologic symptoms.  No fevers, chills, and sweats.  No nausea/vomiting.  His wife reports that his behavior has been normal.   The history is provided by the patient and the spouse.  Fall  This is a new problem. The current episode started 2 days ago. The problem has been gradually worsening. Pertinent negatives include no chest pain, no abdominal pain, no headaches and no shortness of breath. Nothing aggravates the symptoms. Nothing relieves the symptoms. Treatments tried: ice packs. The treatment provided no relief.    Past Medical History:  Diagnosis Date  . Cataract   . H/O colonoscopy with polypectomy 06/12/11   repeat in 5 years   . History of tonsillectomy 1946    Patient Active Problem List   Diagnosis Date Noted  . Acquired deformity of right toe 11/17/2013  . Bilateral inguinal hernia 09/16/2012  . Inguinal hernia 09/09/2012  . BPH (benign prostatic hyperplasia) 05/10/2012  . Rectal stricture 05/10/2012  . Subluxation of metatarsophalangeal joint of lesser toe of right foot 05/10/2012  . Leg length discrepancy 05/09/2012  . Essential hypertension, benign 05/09/2012  . Hyperlipidemia 05/09/2012    Past Surgical History:  Procedure Laterality Date  . BUNIONECTOMY  10/07/2012   Hammer toe  . CATARACT EXTRACTION, BILATERAL  2009  . HEMORRHOID SURGERY    . HERNIA REPAIR    . REPAIR KNEE LIGAMENT  1978  . ROTATOR CUFF REPAIR   2008   left  . TONSILLECTOMY  1946       Home Medications    Prior to Admission medications   Medication Sig Start Date End Date Taking? Authorizing Provider  AMBULATORY NON FORMULARY MEDICATION Medication Name: Singrix IM x 1 repeat dose in 2-6 months. 06/10/17   Hali Marry, MD  aspirin 81 MG tablet Take 81 mg by mouth daily.    [provider]  b complex vitamins capsule Take 1 capsule by mouth daily.    [provider]  Cholecalciferol (VITAMIN D-3) 1000 UNITS CAPS Take by mouth 2 (two) times daily.    [provider]  Coenzyme Q10 (CO Q-10) 100 MG CAPS Take 1 capsule by mouth daily.    [provider]  GLUCOSAMINE-CHONDROITIN-CA-D3 PO Take 2,000 mg by mouth.    [provider]  hydrochlorothiazide (HYDRODIURIL) 25 MG tablet TAKE 1/2 TABLET ONCE DAILY 05/13/17   Hali Marry, MD  lovastatin (MEVACOR) 40 MG tablet take 1 tablet by mouth at bedtime 11/22/16   Hali Marry, MD  Misc Natural Products (PROSTATE) CAPS Take 1 tablet by mouth 2 (two) times daily.    [provider]  vitamin C (ASCORBIC ACID) 500 MG tablet Take 500 mg by mouth daily.    [provider]    Family History Family History  Problem Relation Age of Onset  . Prostate cancer Brother     Social History Social History  Tobacco Use  . Smoking status: Never Smoker  . Smokeless tobacco: Never Used  Substance Use Topics  . Alcohol use: No  . Drug use: No     Allergies   Clindamycin/lincomycin   Review of Systems Review of Systems  Constitutional: Negative for chills, diaphoresis and fever.  HENT: Positive for facial swelling. Negative for dental problem.   Eyes: Negative for photophobia, pain, discharge and visual disturbance.  Respiratory: Negative.  Negative for shortness of breath.   Cardiovascular: Negative for chest pain.  Gastrointestinal: Negative for abdominal pain.  Musculoskeletal:       Minor  abrasions left knee  Skin: Positive for wound.  Neurological: Negative for dizziness, tremors, seizures, syncope, facial asymmetry, speech difficulty, weakness, light-headedness, numbness and headaches.     Physical Exam Triage Vital Signs ED Triage Vitals  Enc Vitals Group     BP 08/10/17 0938 (!) 157/70     Pulse Rate 08/10/17 0938 64     Resp --      Temp 08/10/17 0938 98 F (36.7 C)     Temp Source 08/10/17 0938 Oral     SpO2 08/10/17 0938 97 %     Weight 08/10/17 0939 165 lb (74.8 kg)     Height --      Head Circumference --      Peak Flow --      Pain Score 08/10/17 0939 0     Pain Loc --      Pain Edu? --      Excl. in [place]? --    No data found.  Updated Vital Signs BP (!) 157/70 (BP Location: Right Arm)   Pulse 64   Temp 98 F (36.7 C) (Oral)   Wt 165 lb (74.8 kg)   SpO2 97%   BMI 24.37 kg/m   Visual Acuity Right Eye Distance:   Left Eye Distance:   Bilateral Distance:    Right Eye Near:   Left Eye Near:    Bilateral Near:     Physical Exam  Constitutional: He is oriented to person, place, and time. He appears well-developed. No distress.  HENT:  Head:    Right Ear: External ear normal.  Left Ear: External ear normal.  Nose: Nose normal.  Mouth/Throat: Oropharynx is clear and moist.  Bilateral periorbital swelling, ecchymosis, and mild tenderness to palpation.  Abrasions as noted on diagram.   Eyes: Conjunctivae and EOM are normal. Pupils are equal, round, and reactive to light.  Neck: Normal range of motion.  Cardiovascular: Normal rate.  Pulmonary/Chest: Effort normal.  Neurological: He is alert and oriented to person, place, and time. No cranial nerve deficit.  Skin: Skin is warm and dry.  Nursing note and vitals reviewed.    UC Treatments / Results  Labs (all labs ordered are listed, but only abnormal results are displayed) Labs Reviewed - No data to display  EKG  EKG Interpretation None       Radiology No results  found.  Procedures Procedures (including critical care time)  Medications Ordered in UC Medications - No data to display   Initial Impression / Assessment and Plan / UC Course  I have reviewed the triage vital signs and the nursing notes.  Pertinent labs & imaging results that were available during my care of the patient were reviewed by me and considered in my medical decision making (see chart for details).    Recommend CT head for further evaluation.  Advised patient and wife to proceed  to [facility] ED.    Final Clinical Impressions(s) / UC Diagnoses   Final diagnoses:  Contusion of face, initial encounter    ED Discharge Orders    None           Kandra Nicolas, MD 08/10/17 1012

## 2017-08-10 NOTE — ED Triage Notes (Signed)
Pt states he feel Thursday and landed on his driveway. States left eye was bruised and swollen with abrasions. states last night the right eye began swelling and brusing and sxs have worsened.denies LOC, vision issues or HA.

## 2017-08-10 NOTE — ED Notes (Signed)
Bruising noted to his bilateral periorbital areas

## 2017-11-21 DIAGNOSIS — D224 Melanocytic nevi of scalp and neck: Secondary | ICD-10-CM | POA: Diagnosis not present

## 2017-11-21 DIAGNOSIS — D2272 Melanocytic nevi of left lower limb, including hip: Secondary | ICD-10-CM | POA: Diagnosis not present

## 2017-11-21 DIAGNOSIS — D225 Melanocytic nevi of trunk: Secondary | ICD-10-CM | POA: Diagnosis not present

## 2017-11-21 DIAGNOSIS — D2230 Melanocytic nevi of unspecified part of face: Secondary | ICD-10-CM | POA: Diagnosis not present

## 2017-11-21 DIAGNOSIS — D220 Melanocytic nevi of lip: Secondary | ICD-10-CM | POA: Diagnosis not present

## 2017-11-21 DIAGNOSIS — D2261 Melanocytic nevi of right upper limb, including shoulder: Secondary | ICD-10-CM | POA: Diagnosis not present

## 2017-11-21 DIAGNOSIS — D2262 Melanocytic nevi of left upper limb, including shoulder: Secondary | ICD-10-CM | POA: Diagnosis not present

## 2017-12-09 ENCOUNTER — Ambulatory Visit (INDEPENDENT_AMBULATORY_CARE_PROVIDER_SITE_OTHER): Payer: Medicare HMO | Admitting: Family Medicine

## 2017-12-09 ENCOUNTER — Encounter: Payer: Self-pay | Admitting: Family Medicine

## 2017-12-09 VITALS — BP 134/61 | HR 55 | Ht 69.0 in | Wt 168.0 lb

## 2017-12-09 DIAGNOSIS — R011 Cardiac murmur, unspecified: Secondary | ICD-10-CM

## 2017-12-09 DIAGNOSIS — I10 Essential (primary) hypertension: Secondary | ICD-10-CM | POA: Diagnosis not present

## 2017-12-09 DIAGNOSIS — E7800 Pure hypercholesterolemia, unspecified: Secondary | ICD-10-CM

## 2017-12-09 LAB — BASIC METABOLIC PANEL WITH GFR
BUN: 16 mg/dL (ref 7–25)
CO2: 30 mmol/L (ref 20–32)
CREATININE: 0.9 mg/dL (ref 0.70–1.11)
Calcium: 9 mg/dL (ref 8.6–10.3)
Chloride: 106 mmol/L (ref 98–110)
GFR, EST NON AFRICAN AMERICAN: 79 mL/min/{1.73_m2} (ref >=60)
GFR, Est African American: 91 mL/min/{1.73_m2} (ref >=60)
Glucose, Bld: 73 mg/dL (ref 65–99)
Potassium: 4.3 mmol/L (ref 3.5–5.3)
SODIUM: 141 mmol/L (ref 135–146)

## 2017-12-09 NOTE — Progress Notes (Signed)
Subjective:    CC: BP  HPI:  Hypertension- Pt denies chest pain, SOB, dizziness, or heart palpitations.  Taking meds as directed w/o problems.  Denies medication side effects.    He plans on getting his eye exam done this fall.  He still very active and does a lot of yard work and works on his house in [employer].  He does have a small garden as well.  Hyperlipidemia-tolerating statin well without any side effects or problems.  Last lipid level from December is up-to-date.  Past medical history, Surgical history, Family history not pertinant except as noted below, Social history, Allergies, and medications have been entered into the medical record, reviewed, and corrections made.   Review of Systems: No fevers, chills, night sweats, weight loss, chest pain, or shortness of breath.   Objective:    General: Well Developed, well nourished, and in no acute distress.  Neuro: Alert and oriented x3, extra-ocular muscles intact, sensation grossly intact.  HEENT: Normocephalic, atraumatic  Skin: Warm and dry, no rashes. Cardiac: Regular rate and rhythm, no murmurs rubs or gallops, no lower extremity edema.  2 out of 6 systolic ejection murmur. Respiratory: Clear to auscultation bilaterally. Not using accessory muscles, speaking in full sentences.   Impression and Recommendations:    HTN -well controlled.  Continue current regimen.  Follow-up in 6 months.  Hyperlipidemia-continue current regimen.  Cardiac murmur-we will schedule for echocardiogram.

## 2017-12-10 NOTE — Progress Notes (Signed)
All labs are normal. 

## 2018-01-01 ENCOUNTER — Ambulatory Visit (HOSPITAL_BASED_OUTPATIENT_CLINIC_OR_DEPARTMENT_OTHER)
Admission: RE | Admit: 2018-01-01 | Discharge: 2018-01-01 | Disposition: A | Discharge status: Home / Self Care | Payer: Medicare HMO | Source: Ambulatory Visit | Attending: Family Medicine | Admitting: Family Medicine

## 2018-01-01 DIAGNOSIS — I081 Rheumatic disorders of both mitral and tricuspid valves: Secondary | ICD-10-CM | POA: Diagnosis not present

## 2018-01-01 DIAGNOSIS — I10 Essential (primary) hypertension: Secondary | ICD-10-CM | POA: Diagnosis not present

## 2018-01-01 DIAGNOSIS — R011 Cardiac murmur, unspecified: Secondary | ICD-10-CM

## 2018-01-01 DIAGNOSIS — E785 Hyperlipidemia, unspecified: Secondary | ICD-10-CM | POA: Diagnosis not present

## 2018-01-01 NOTE — Progress Notes (Signed)
  Echocardiogram 2D Echocardiogram has been performed.  Patrick Perry 01/01/2018, 8:51 AM

## 2018-01-28 ENCOUNTER — Other Ambulatory Visit: Payer: Self-pay | Admitting: Family Medicine

## 2018-02-18 DIAGNOSIS — H33303 Unspecified retinal break, bilateral: Secondary | ICD-10-CM | POA: Diagnosis not present

## 2018-02-18 DIAGNOSIS — H524 Presbyopia: Secondary | ICD-10-CM | POA: Diagnosis not present

## 2018-03-31 DIAGNOSIS — R69 Illness, unspecified: Secondary | ICD-10-CM | POA: Diagnosis not present

## 2018-04-01 DIAGNOSIS — R69 Illness, unspecified: Secondary | ICD-10-CM | POA: Diagnosis not present

## 2018-05-03 ENCOUNTER — Other Ambulatory Visit: Payer: Self-pay | Admitting: Family Medicine

## 2018-05-26 ENCOUNTER — Encounter: Payer: Self-pay | Admitting: Family Medicine

## 2018-05-26 ENCOUNTER — Ambulatory Visit (INDEPENDENT_AMBULATORY_CARE_PROVIDER_SITE_OTHER): Payer: Medicare HMO | Admitting: Family Medicine

## 2018-05-26 VITALS — BP 136/66 | HR 54 | Ht 69.0 in | Wt 160.0 lb

## 2018-05-26 DIAGNOSIS — Z125 Encounter for screening for malignant neoplasm of prostate: Secondary | ICD-10-CM | POA: Diagnosis not present

## 2018-05-26 DIAGNOSIS — E7800 Pure hypercholesterolemia, unspecified: Secondary | ICD-10-CM

## 2018-05-26 DIAGNOSIS — I10 Essential (primary) hypertension: Secondary | ICD-10-CM

## 2018-05-26 NOTE — Patient Instructions (Signed)
OK to stop your baby Aspirin

## 2018-05-26 NOTE — Progress Notes (Signed)
Subjective:    CC: HTN  HPI:  Hypertension- Pt denies chest pain, SOB, dizziness, or heart palpitations.  Taking meds as directed w/o problems.  Denies medication side effects.    Hyperlipidemia - Tolerating statin with with no side effects or myalgias.     Past medical history, Surgical history, Family history not pertinant except as noted below, Social history, Allergies, and medications have been entered into the medical record, reviewed, and corrections made.   Review of Systems: No fevers, chills, night sweats, weight loss, chest pain, or shortness of breath.   Objective:    General: Well Developed, well nourished, and in no acute distress.  Neuro: Alert and oriented x3, extra-ocular muscles intact, sensation grossly intact.  HEENT: Normocephalic, atraumatic  Skin: Warm and dry, no rashes. Cardiac: Regular rate and rhythm, no murmurs rubs or gallops, no lower extremity edema. No caroid bruits.   Respiratory: Clear to auscultation bilaterally. Not using accessory muscles, speaking in full sentences.   Impression and Recommendations:    HTN - Well controlled. Continue current regimen. Follow up in  6 months.  Due for screening labs.    Hyperlipidemia - tolerating Mevacor well. Due for lipid check.    OK to stop your baby Aspirin. No prior hx of stroke or MI.

## 2018-05-27 LAB — COMPLETE METABOLIC PANEL WITH GFR
AG Ratio: 1.3 (calc) (ref 1.0–2.5)
ALBUMIN MSPROF: 4 g/dL (ref 3.6–5.1)
ALT: 10 U/L (ref 9–46)
AST: 26 U/L (ref 10–35)
Alkaline phosphatase (APISO): 63 U/L (ref 40–115)
BILIRUBIN TOTAL: 0.3 mg/dL (ref 0.2–1.2)
BUN: 20 mg/dL (ref 7–25)
CHLORIDE: 106 mmol/L (ref 98–110)
CO2: 31 mmol/L (ref 20–32)
Calcium: 8.8 mg/dL (ref 8.6–10.3)
Creat: 0.93 mg/dL (ref 0.70–1.11)
GFR, EST AFRICAN AMERICAN: 88 mL/min/{1.73_m2} (ref >=60)
GFR, Est Non African American: 76 mL/min/{1.73_m2} (ref >=60)
GLOBULIN: 3 g/dL (ref 1.9–3.7)
Glucose, Bld: 91 mg/dL (ref 65–99)
POTASSIUM: 4.2 mmol/L (ref 3.5–5.3)
SODIUM: 141 mmol/L (ref 135–146)
TOTAL PROTEIN: 7 g/dL (ref 6.1–8.1)

## 2018-05-27 LAB — LIPID PANEL
Cholesterol: 134 mg/dL (ref <200)
HDL: 46 mg/dL (ref >40)
LDL Cholesterol (Calc): 74 mg/dL (calc)
Non-HDL Cholesterol (Calc): 88 mg/dL (calc) (ref <130)
TRIGLYCERIDES: 65 mg/dL (ref <150)
Total CHOL/HDL Ratio: 2.9 (calc) (ref <5.0)

## 2018-05-27 LAB — PSA: PSA: 2.8 ng/mL (ref <=4.0)

## 2018-06-02 NOTE — Progress Notes (Signed)
Subjective:   Patrick Perry is a 84 y.o. male who presents for Medicare Annual/Subsequent preventive examination.  Review of Systems:  No ROS.  Medicare Wellness Visit. Additional risk factors are reflected in the social history.  Cardiac Risk Factors include: advanced age (>29men, >21 women);hypertension;male gender  Sleep patterns:  Getting on average 6.5-7.5 hours of sleep a night. Wakes up 1 time during the night at times to use the bathroom. Once awake feels rested and refreshed.  Home Safety/Smoke Alarms: Feels safe in home. Smoke alarms in place.  Living environment; Lives with wife in 1 story home and steps have handrails on them. Shower is a walk in shower with grab bar in place. Seat Belt Safety/Bike Helmet: Wears seat belt.     Male:   CCS-  utd   PSA- utd Lab Results  Component Value Date   PSA 2.8 05/26/2018   PSA 2.0 06/06/2016   PSA 2.42 06/01/2015        Objective:    Vitals: BP (!) 124/58   Pulse 63   Ht 5\' 9"  (1.753 m)   Wt 169 lb (76.7 kg)   SpO2 98%   BMI 24.96 kg/m   Body mass index is 24.96 kg/m.  Advanced Directives 06/11/2018 08/10/2017 06/10/2017 05/25/2014  Does Patient Have a Medical Advance Directive? Yes No Yes Yes  Type of Paramedic of [place];Living will - [place];Living will Living will;Healthcare Power of Attorney  Does patient want to make changes to medical advance directive? No - Patient declined - - No - Patient declined  Copy of [place] in Chart? No - copy requested - - No - copy requested    Tobacco Social History   Tobacco Use  Smoking Status Never Smoker  Smokeless Tobacco Never Used     Counseling given: Not Answered   Clinical Intake:  Pre-visit preparation completed: Yes  Pain : No/denies pain     Nutritional Risks: None Diabetes: No  How often do you need to have someone help you when you read instructions, pamphlets, or other written  materials from your doctor or pharmacy?: 1 - Never What is the last grade level you completed in school?: 16  Interpreter Needed?: No  Information entered by :: Patrick Dakin, LPN  Past Medical History:  Diagnosis Date  . Cataract   . H/O colonoscopy with polypectomy 06/12/11   repeat in 5 years   . History of tonsillectomy 1946   Past Surgical History:  Procedure Laterality Date  . BUNIONECTOMY  10/07/2012   Hammer toe  . CATARACT EXTRACTION, BILATERAL  2009  . HEMORRHOID SURGERY    . HERNIA REPAIR    . REPAIR KNEE LIGAMENT  1978  . ROTATOR CUFF REPAIR  2008   left  . TONSILLECTOMY  1946   Family History  Problem Relation Age of Onset  . Prostate cancer Brother    Social History   Socioeconomic History  . Marital status: Married    Spouse name: Patrick Perry  . Number of children: 2  . Years of education: BA  . Highest education level: Bachelor's degree (e.g., BA, AB, BS)  Occupational History  . Occupation: Retired.    Comment: engineering  Social Needs  . Financial resource strain: Not hard at all  . Food insecurity:    Worry: Never true    Inability: Never true  . Transportation needs:    Medical: No    Non-medical: No  Tobacco  Use  . Smoking status: Never Smoker  . Smokeless tobacco: Never Used  Substance and Sexual Activity  . Alcohol use: No  . Drug use: No  . Sexual activity: Yes  Lifestyle  . Physical activity:    Days per week: 3 days    Minutes per session: 40 min  . Stress: Not at all  Relationships  . Social connections:    Talks on phone: More than three times a week    Gets together: Three times a week    Attends religious service: More than 4 times per year    Active member of club or organization: No    Attends meetings of clubs or organizations: Never    Relationship status: Married  Other Topics Concern  . Not on file  Social History Narrative   Works out 3 x per week, treadmill, home gym. No caffeine. Worked as an Chief Financial Officer.     Outpatient Encounter Medications as of 06/11/2018  Medication Sig  . b complex vitamins capsule Take 1 capsule by mouth daily.  . Cholecalciferol (VITAMIN D-3) 1000 UNITS CAPS Take by mouth 2 (two) times daily.  . Coenzyme Q10 (COQ10 PO) Take 5 mg by mouth daily.  Marland Kitchen GLUCOSAMINE-CHONDROITIN-CA-D3 PO Take 2,000 mg by mouth.  . hydrochlorothiazide (HYDRODIURIL) 25 MG tablet TAKE 1/2 TABLET ONCE DAILY  . lovastatin (MEVACOR) 40 MG tablet TAKE 1 TABLET AT BEDTIME  . Misc Natural Products (PROSTATE) CAPS Take 1 tablet by mouth 2 (two) times daily.  . vitamin C (ASCORBIC ACID) 500 MG tablet Take 500 mg by mouth daily.   No facility-administered encounter medications on file as of 06/11/2018.     Activities of Daily Living In your present state of health, do you have any difficulty performing the following activities: 06/11/2018  Hearing? N  Vision? N  Difficulty concentrating or making decisions? N  Walking or climbing stairs? N  Dressing or bathing? N  Doing errands, shopping? N  Preparing Food and eating ? N  Using the Toilet? N  In the past six months, have you accidently leaked urine? N  Do you have problems with loss of bowel control? N  Managing your Medications? N  Managing your Finances? N  Housekeeping or managing your Housekeeping? N  Some recent data might be hidden    Patient Care Team: Patrick Marry, MD as PCP - General (Family Medicine) Patrick Perry, [place] (Optometry) Patrick Pigg, MD as Attending Physician (Dermatology)   Assessment:   This is a routine wellness examination for Patrick Perry.Physical assessment deferred to PCP. Patient plans on getting the shingles vaccine at his pharmacy once he gets over the cold that he has.   Exercise Activities and Dietary recommendations Current Exercise Habits: Home exercise routine, Type of exercise: treadmill;Other - see comments(gym machine at home), Time (Minutes): 40, Frequency (Times/Week): 3, Weekly Exercise  (Minutes/Week): 120, Intensity: Moderate, Exercise limited by: None identified Diet Eats a healthy diet with not a a lot of red meat Breakfast: cereal with protein powder and ground flax seed, skim milk with blueberries and banana. Lunch: soup or stew. Sometimes if out will get a sandwich. Dinner: meat and a vegetable or leftovers.       Goals    . Exercise 3x per week (30 min per time)     Exercise more than three times a week to help keep his muscles strong in his legs. Gym has silver sneakers but he has a gym at home.  Fall Risk Fall Risk  06/11/2018 12/09/2017 06/10/2017 12/05/2016 01/05/2016  Falls in the past year? 0 Yes No No No  Number falls in past yr: - 1 - - -  Injury with Fall? - No - - -  Risk for fall due to : - Other (Comment) - - -  Risk for fall due to: Comment - tripped on debris  - - -  Follow up - Falls prevention discussed - - -   Is the patient's home free of loose throw rugs in walkways, pet beds, electrical cords, etc?   yes      Grab bars in the bathroom? yes      Handrails on the stairs?   yes      Adequate lighting?   yes   Depression Screen PHQ 2/9 Scores 06/11/2018 12/09/2017 06/10/2017 06/10/2017  PHQ - 2 Score 0 0 0 0    Cognitive Function     6CIT Screen 06/11/2018 06/10/2017 06/06/2016  What Year? 0 points 0 points 0 points  What month? 0 points 0 points 0 points  What time? 0 points 0 points 0 points  Count back from 20 0 points 0 points 0 points  Months in reverse 0 points 0 points 0 points  Repeat phrase 2 points 0 points 0 points  Total Score 2 0 0    Immunization History  Administered Date(s) Administered  . Influenza Split 03/19/2012  . Influenza, High Dose Seasonal PF 04/21/2013, 03/26/2017, 03/31/2018  . Influenza,inj,Quad PF,6+ Mos 04/07/2015  . Influenza-Unspecified 04/02/2016  . Pneumococcal Conjugate-13 05/25/2014  . Pneumococcal Polysaccharide-23 04/30/2006  . Tdap 05/01/2010  . Zoster 04/03/2006    Screening  Tests Health Maintenance  Topic Date Due  . TETANUS/TDAP  05/01/2020  . INFLUENZA VACCINE  Completed  . PNA vac Low Risk Adult  Completed        Plan:      Mr. Knightly , Thank you for taking time to come for your Medicare Wellness Visit. I appreciate your ongoing commitment to your health goals. Please review the following plan we discussed and let me know if I can assist you in the future.  Please schedule your next medicare wellness visit with me in 1 yr. Continue doing brain stimulating activities (puzzles, reading, adult coloring books, staying active) to keep memory sharp.  Bring a copy of your living will and/or healthcare power of attorney to your next office visit. Your doing a great job with your health. Continue the good work and have a [employer]  These are the goals we discussed: Goals    . Exercise 3x per week (30 min per time)     Exercise more than three times a week to help keep his muscles strong in his legs. Gym has silver sneakers but he has a gym at home.       This is a list of the screening recommended for you and due dates:  Health Maintenance  Topic Date Due  . Tetanus Vaccine  05/01/2020  . Flu Shot  Completed  . Pneumonia vaccines  Completed     I have personally reviewed and noted the following in the patient's chart:   . Medical and social history . Use of alcohol, tobacco or illicit drugs  . Current medications and supplements . Functional ability and status . Nutritional status . Physical activity . Advanced directives . List of other physicians . Hospitalizations, surgeries, and ER visits in previous 12 months . Vitals . Screenings to  include cognitive, depression, and falls . Referrals and appointments  In addition, I have reviewed and discussed with patient certain preventive protocols, quality metrics, and best practice recommendations. A written personalized care plan for preventive services as well as general preventive health  recommendations were provided to patient.     Joanne Chars, LPN  [id number]

## 2018-06-11 ENCOUNTER — Ambulatory Visit (INDEPENDENT_AMBULATORY_CARE_PROVIDER_SITE_OTHER): Payer: Medicare HMO | Admitting: *Deleted

## 2018-06-11 VITALS — BP 124/58 | HR 63 | Ht 69.0 in | Wt 169.0 lb

## 2018-06-11 DIAGNOSIS — Z0000 Encounter for general adult medical examination without abnormal findings: Secondary | ICD-10-CM

## 2018-06-11 NOTE — Patient Instructions (Signed)
[name] , Thank you for taking time to come for your Medicare Wellness Visit. I appreciate your ongoing commitment to your health goals. Please review the following plan we discussed and let me know if I can assist you in the future.  Please schedule your next medicare wellness visit with me in 1 yr. Continue doing brain stimulating activities (puzzles, reading, adult coloring books, staying active) to keep memory sharp.  Bring a copy of your living will and/or healthcare power of attorney to your next office visit. Your doing a great job with your health. Continue the good work and have a [employer]  These are the goals we discussed: Goals    . Exercise 3x per week (30 min per time)     Exercise more than three times a week to help keep his muscles strong in his legs. Gym has silver sneakers but he has a gym at home.     Health Maintenance After Age 47 After age 66, you are at a higher risk for certain long-term diseases and infections as well as injuries from falls. Falls are a major cause of broken bones and head injuries in people who are older than age 55. Getting regular preventive care can help to keep you healthy and well. Preventive care includes getting regular testing and making lifestyle changes as recommended by your health care provider. Talk with your health care provider about:  Which screenings and tests you should have. A screening is a test that checks for a disease when you have no symptoms.  A diet and exercise plan that is right for you. What should I know about screenings and tests to prevent falls? Screening and testing are the best ways to find a health problem early. Early diagnosis and treatment give you the best chance of managing medical conditions that are common after age 73. Certain conditions and lifestyle choices may make you more likely to have a fall. Your health care provider may recommend:  Regular vision checks. Poor vision and conditions such as  cataracts can make you more likely to have a fall. If you wear glasses, make sure to get your prescription updated if your vision changes.  Medicine review. Work with your health care provider to regularly review all of the medicines you are taking, including over-the-counter medicines. Ask your health care provider about any side effects that may make you more likely to have a fall. Tell your health care provider if any medicines that you take make you feel dizzy or sleepy.  Osteoporosis screening. Osteoporosis is a condition that causes the bones to get weaker. This can make the bones weak and cause them to break more easily.  Blood pressure screening. Blood pressure changes and medicines to control blood pressure can make you feel dizzy.  Strength and balance checks. Your health care provider may recommend certain tests to check your strength and balance while standing, walking, or changing positions.  Foot health exam. Foot pain and numbness, as well as not wearing proper footwear, can make you more likely to have a fall.  Depression screening. You may be more likely to have a fall if you have a fear of falling, feel emotionally low, or feel unable to do activities that you used to do.  Alcohol use screening. Using too much alcohol can affect your balance and may make you more likely to have a fall. What actions can I take to lower my risk of falls? General instructions  Talk with your  health care provider about your risks for falling. Tell your health care provider if: ? You fall. Be sure to tell your health care provider about all falls, even ones that seem minor. ? You feel dizzy, sleepy, or off-balance.  Take over-the-counter and prescription medicines only as told by your health care provider. These include any supplements.  Eat a healthy diet and maintain a healthy weight. A healthy diet includes low-fat dairy products, low-fat (lean) meats, and fiber from whole grains, beans, and  lots of fruits and vegetables. Home safety  Remove any tripping hazards, such as rugs, cords, and clutter.  Install safety equipment such as grab bars in bathrooms and safety rails on stairs.  Keep rooms and walkways well-lit. Activity   Follow a regular exercise program to stay fit. This will help you maintain your balance. Ask your health care provider what types of exercise are appropriate for you.  If you need a cane or walker, use it as recommended by your health care provider.  Wear supportive shoes that have nonskid soles. Lifestyle  Do not drink alcohol if your health care provider tells you not to drink.  If you drink alcohol, limit how much you have: ? 0-1 drink a day for women. ? 0-2 drinks a day for men.  Be aware of how much alcohol is in your drink. In the [place] one drink equals one typical bottle of beer (12 oz), one-half glass of wine (5 oz), or one shot of hard liquor (1 oz).  Do not use any products that contain nicotine or tobacco, such as cigarettes and e-cigarettes. If you need help quitting, ask your health care provider. Summary  Having a healthy lifestyle and getting preventive care can help to protect your health and wellness after age 53.  Screening and testing are the best way to find a health problem early and help you avoid having a fall. Early diagnosis and treatment give you the best chance for managing medical conditions that are more common for people who are older than age 48.  Falls are a major cause of broken bones and head injuries in people who are older than age 74. Take precautions to prevent a fall at home.  Work with your health care provider to learn what changes you can make to improve your health and wellness and to prevent falls. This information is not intended to replace advice given to you by your health care provider. Make sure you discuss any questions you have with your health care provider. Document Released: 04/24/2017  Document Revised: 04/24/2017 Document Reviewed: 04/24/2017 Elsevier Interactive Patient Education  2019 [employer].

## 2018-06-12 ENCOUNTER — Encounter: Payer: Medicare HMO | Admitting: Family Medicine

## 2018-11-25 ENCOUNTER — Encounter: Payer: Self-pay | Admitting: Family Medicine

## 2018-11-25 ENCOUNTER — Ambulatory Visit (INDEPENDENT_AMBULATORY_CARE_PROVIDER_SITE_OTHER): Payer: Medicare HMO | Admitting: Family Medicine

## 2018-11-25 VITALS — BP 124/66 | Temp 97.4°F | Wt 154.0 lb

## 2018-11-25 DIAGNOSIS — H938X1 Other specified disorders of right ear: Secondary | ICD-10-CM | POA: Diagnosis not present

## 2018-11-25 DIAGNOSIS — R5383 Other fatigue: Secondary | ICD-10-CM | POA: Diagnosis not present

## 2018-11-25 DIAGNOSIS — E7800 Pure hypercholesterolemia, unspecified: Secondary | ICD-10-CM

## 2018-11-25 DIAGNOSIS — I10 Essential (primary) hypertension: Secondary | ICD-10-CM | POA: Diagnosis not present

## 2018-11-25 MED ORDER — LOVASTATIN 40 MG PO TABS: ORAL_TABLET | ORAL | 3 refills | Status: DC

## 2018-11-25 MED ORDER — HYDROCHLOROTHIAZIDE 25 MG PO TABS: 12.5000 mg | ORAL_TABLET | Freq: Every day | ORAL | 3 refills | Status: DC

## 2018-11-25 NOTE — Progress Notes (Signed)
Virtual Visit via Video Note  I connected with Patrick Perry on 11/25/18 at  9:10 AM EDT by a video enabled telemedicine application and verified that I am speaking with the correct person using two identifiers.   I discussed the limitations of evaluation and management by telemedicine and the availability of in person appointments. The patient expressed understanding and agreed to proceed.  Pt was at home and I was in my office for the virtual visit.     Subjective:    CC: F/U BP   HPI: Hypertension- Pt denies chest pain, SOB, dizziness, or heart palpitations.  Taking meds as directed w/o problems.  Denies medication side effects.    F/U hyperlipidemia-taking his statin regularly.  He is also taking co-Q10 because he read that statins can lower co-Q10.  He wants his Testerone level check. He has dec libidoe.  No mood change. He is still very active.   Right ear will sweat at night when he sleeps on that side and will feel a crawling sensation. He doesn't get a lot of wax.  He has not had any significant change in hearing loss recently though he does wear hearing aids.  Past medical history, Surgical history, Family history not pertinant except as noted below, Social history, Allergies, and medications have been entered into the medical record, reviewed, and corrections made.   Review of Systems: No fevers, chills, night sweats, weight loss, chest pain, or shortness of breath.   Objective:    General: Speaking clearly in complete sentences without any shortness of breath.  Alert and oriented x3.  Normal judgment. No apparent acute distress. Well groomed.     Impression and Recommendations:    HTN -well controlled we will continue current regimen.  Plan to follow-up in 6 months 2.  Due to recheck BMP.  Hyperlipidemia-okay to take co-Q10 and his statin.  He does want to make sure it was safe to do so.  Right ear sweat -just explained that this is really benign and can occur  particularly if he sleeps more on that right ear it can trap sweat and then create a little crawling sensation.  Low libido-we will go ahead and check testosterone goals per his preference.  This is been doing a lot of reading about it and would like to see what his levels are.  At 75 I suspect they are probably on the low end of normal which still is not out of the norm for his age but we will check and see.      I discussed the assessment and treatment plan with the patient. The patient was provided an opportunity to ask questions and all were answered. The patient agreed with the plan and demonstrated an understanding of the instructions.   The patient was advised to call back or seek an in-person evaluation if the symptoms worsen or if the condition fails to improve as anticipated.   Beatrice Lecher, MD

## 2018-11-26 LAB — TESTOSTERONE TOTAL,FREE,BIO, MALES
Albumin: 4.4 g/dL (ref 3.6–5.1)
Sex Hormone Binding: 54 nmol/L (ref 22–77)
Testosterone, Bioavailable: 92.2 ng/dL (ref 15.0–150.0)
Testosterone, Free: 45.8 pg/mL (ref 6.0–73.0)
Testosterone: 519 ng/dL (ref 250–827)

## 2018-11-26 LAB — BASIC METABOLIC PANEL WITH GFR
BUN: 18 mg/dL (ref 7–25)
CO2: 29 mmol/L (ref 20–32)
Calcium: 9.7 mg/dL (ref 8.6–10.3)
Chloride: 105 mmol/L (ref 98–110)
Creat: 1.07 mg/dL (ref 0.70–1.11)
GFR, Est African American: 73 mL/min/{1.73_m2} (ref >=60)
GFR, Est Non African American: 63 mL/min/{1.73_m2} (ref >=60)
Glucose, Bld: 88 mg/dL (ref 65–99)
Potassium: 4.1 mmol/L (ref 3.5–5.3)
Sodium: 141 mmol/L (ref 135–146)

## 2018-12-31 DIAGNOSIS — D2262 Melanocytic nevi of left upper limb, including shoulder: Secondary | ICD-10-CM | POA: Diagnosis not present

## 2018-12-31 DIAGNOSIS — D2272 Melanocytic nevi of left lower limb, including hip: Secondary | ICD-10-CM | POA: Diagnosis not present

## 2018-12-31 DIAGNOSIS — D2261 Melanocytic nevi of right upper limb, including shoulder: Secondary | ICD-10-CM | POA: Diagnosis not present

## 2018-12-31 DIAGNOSIS — D225 Melanocytic nevi of trunk: Secondary | ICD-10-CM | POA: Diagnosis not present

## 2018-12-31 DIAGNOSIS — D224 Melanocytic nevi of scalp and neck: Secondary | ICD-10-CM | POA: Diagnosis not present

## 2018-12-31 DIAGNOSIS — L821 Other seborrheic keratosis: Secondary | ICD-10-CM | POA: Diagnosis not present

## 2018-12-31 DIAGNOSIS — D220 Melanocytic nevi of lip: Secondary | ICD-10-CM | POA: Diagnosis not present

## 2018-12-31 DIAGNOSIS — L723 Sebaceous cyst: Secondary | ICD-10-CM | POA: Diagnosis not present

## 2018-12-31 DIAGNOSIS — D2230 Melanocytic nevi of unspecified part of face: Secondary | ICD-10-CM | POA: Diagnosis not present

## 2019-03-31 DIAGNOSIS — R69 Illness, unspecified: Secondary | ICD-10-CM | POA: Diagnosis not present

## 2019-04-13 DIAGNOSIS — R69 Illness, unspecified: Secondary | ICD-10-CM | POA: Diagnosis not present

## 2019-04-29 DIAGNOSIS — R69 Illness, unspecified: Secondary | ICD-10-CM | POA: Diagnosis not present

## 2019-05-27 ENCOUNTER — Ambulatory Visit: Payer: Medicare HMO | Admitting: Family Medicine

## 2019-06-01 ENCOUNTER — Other Ambulatory Visit: Payer: Self-pay

## 2019-06-01 ENCOUNTER — Encounter: Payer: Self-pay | Admitting: Family Medicine

## 2019-06-01 ENCOUNTER — Ambulatory Visit (INDEPENDENT_AMBULATORY_CARE_PROVIDER_SITE_OTHER): Payer: Medicare HMO | Admitting: Family Medicine

## 2019-06-01 VITALS — BP 129/67 | HR 51 | Ht 69.0 in | Wt 157.0 lb

## 2019-06-01 DIAGNOSIS — R768 Other specified abnormal immunological findings in serum: Secondary | ICD-10-CM | POA: Diagnosis not present

## 2019-06-01 DIAGNOSIS — I10 Essential (primary) hypertension: Secondary | ICD-10-CM | POA: Diagnosis not present

## 2019-06-01 DIAGNOSIS — N401 Enlarged prostate with lower urinary tract symptoms: Secondary | ICD-10-CM

## 2019-06-01 MED ORDER — HYDROCHLOROTHIAZIDE 25 MG PO TABS: 12.5000 mg | ORAL_TABLET | Freq: Every day | ORAL | 3 refills | Status: DC

## 2019-06-01 NOTE — Assessment & Plan Note (Signed)
BP well controlled at home.  Continue current regimen.  Due for up-to-date lab work.  Follow-up in 6 months.

## 2019-06-01 NOTE — Progress Notes (Signed)
Established Patient Office Visit  Subjective:  Patient ID: Patrick Perry, male    DOB: [birth year]  Age: 85 y.o. MRN: [id number]  CC:  Chief Complaint  Patient presents with  . Hypertension    HPI Patrick Perry presents for   Hypertension- Pt denies chest pain, SOB, dizziness, or heart palpitations.  Taking meds as directed w/o problems.  Denies medication side effects.    Follow-up BPH-currently on a prostate supplement called prostate 600+.  He has been on it for years and feels like it works well controls his symptoms he is happy with that and is not interested in prescription medication at this time.  He donates blood regularly and the last time he went to donate blood came back that his hepatitis C antibodies were positive.  This general test were negative for active hepatitis C.  But he did bring in a copy of the blood work for [place] to make a copy he is no longer able to donate blood to the [employer].  Past Medical History:  Diagnosis Date  . Cataract   . H/O colonoscopy with polypectomy 06/12/11   repeat in 5 years   . History of tonsillectomy 1946    Past Surgical History:  Procedure Laterality Date  . BUNIONECTOMY  10/07/2012   Hammer toe  . CATARACT EXTRACTION, BILATERAL  2009  . HEMORRHOID SURGERY    . HERNIA REPAIR    . REPAIR KNEE LIGAMENT  1978  . ROTATOR CUFF REPAIR  2008   left  . TONSILLECTOMY  1946    Family History  Problem Relation Age of Onset  . Prostate cancer Brother     Social History   Socioeconomic History  . Marital status: Married    Spouse name: Patrick Perry  . Number of children: 2  . Years of education: BA  . Highest education level: Bachelor's degree (e.g., BA, AB, BS)  Occupational History  . Occupation: Retired.    Comment: engineering  Social Needs  . Financial resource strain: Not hard at all  . Food insecurity    Worry: Never true    Inability: Never true  . Transportation needs    Medical: No    Non-medical: No  Tobacco  Use  . Smoking status: Never Smoker  . Smokeless tobacco: Never Used  Substance and Sexual Activity  . Alcohol use: No  . Drug use: No  . Sexual activity: Yes  Lifestyle  . Physical activity    Days per week: 3 days    Minutes per session: 40 min  . Stress: Not at all  Relationships  . Social connections    Talks on phone: More than three times a week    Gets together: Three times a week    Attends religious service: More than 4 times per year    Active member of club or organization: No    Attends meetings of clubs or organizations: Never    Relationship status: Married  . Intimate partner violence    Fear of current or ex partner: No    Emotionally abused: No    Physically abused: No    Forced sexual activity: No  Other Topics Concern  . Not on file  Social History Narrative   Works out 3 x per week, treadmill, home gym. No caffeine. Worked as an Chief Financial Officer.    Outpatient Medications Prior to Visit  Medication Sig Dispense Refill  . b complex vitamins capsule Take 1 capsule by mouth daily.    Marland Kitchen  Cholecalciferol (VITAMIN D-3) 1000 UNITS CAPS Take by mouth 2 (two) times daily.    . Coenzyme Q10 (COQ10 PO) Take 5 mg by mouth daily.    Marland Kitchen GLUCOSAMINE-CHONDROITIN-CA-D3 PO Take 2,000 mg by mouth.    . lovastatin (MEVACOR) 40 MG tablet TAKE 1 TABLET AT BEDTIME 90 tablet 3  . Misc Natural Products (PROSTATE) CAPS Take 1 tablet by mouth 2 (two) times daily.    . vitamin C (ASCORBIC ACID) 500 MG tablet Take 500 mg by mouth daily.    . hydrochlorothiazide (HYDRODIURIL) 25 MG tablet Take 0.5 tablets (12.5 mg total) by mouth daily. 45 tablet 3   No facility-administered medications prior to visit.     Allergies  Allergen Reactions  . Clindamycin/Lincomycin     rash    ROS Review of Systems    Objective:    Physical Exam  Constitutional: He is oriented to person, place, and time. He appears well-developed and well-nourished.  HENT:  Head: Normocephalic and atraumatic.   Cardiovascular: Normal rate, regular rhythm and normal heart sounds.  Pulmonary/Chest: Effort normal and breath sounds normal.  Neurological: He is alert and oriented to person, place, and time.  Skin: Skin is warm and dry.  Psychiatric: He has a normal mood and affect. His behavior is normal.    BP 129/67   Pulse (!) 51   Ht 5\' 9"  (1.753 m)   Wt 157 lb (71.2 kg)   SpO2 100%   BMI 23.18 kg/m  Wt Readings from Last 3 Encounters:  06/01/19 157 lb (71.2 kg)  11/25/18 154 lb (69.9 kg)  06/11/18 169 lb (76.7 kg)     There are no preventive care reminders to display for this patient.  There are no preventive care reminders to display for this patient.  No results found for: TSH Lab Results  Component Value Date   WBC 6.0 05/09/2012   HGB 15.8 05/09/2012   HCT 45.8 05/09/2012   MCV 92.0 05/09/2012   PLT 240 05/09/2012   Lab Results  Component Value Date   NA 141 11/25/2018   K 4.1 11/25/2018   CO2 29 11/25/2018   GLUCOSE 88 11/25/2018   BUN 18 11/25/2018   CREATININE 1.07 11/25/2018   BILITOT 0.3 05/26/2018   ALKPHOS 54 06/06/2016   AST 26 05/26/2018   ALT 10 05/26/2018   PROT 7.0 05/26/2018   ALBUMIN 4.1 06/06/2016   CALCIUM 9.7 11/25/2018   Lab Results  Component Value Date   CHOL 134 05/26/2018   Lab Results  Component Value Date   HDL 46 05/26/2018   Lab Results  Component Value Date   LDLCALC 74 05/26/2018   Lab Results  Component Value Date   TRIG 65 05/26/2018   Lab Results  Component Value Date   CHOLHDL 2.9 05/26/2018   No results found for: HGBA1C    Assessment & Plan:   Problem List Items Addressed This Visit      Cardiovascular and Mediastinum   Essential hypertension, benign - Primary    BP well controlled at home.  Continue current regimen.  Due for up-to-date lab work.  Follow-up in 6 months.      Relevant Medications   hydrochlorothiazide (HYDRODIURIL) 25 MG tablet   Other Relevant Orders   COMPLETE METABOLIC PANEL WITH  GFR   Lipid panel     Genitourinary   BPH (benign prostatic hyperplasia)    Currently happy with his over-the-counter supplement he has been on it for years and  feels like it controls his symptoms.        Other   Positive hepatitis C antibody test    Negative for hepatitis C virus itself so no active infection.         Meds ordered this encounter  Medications  . hydrochlorothiazide (HYDRODIURIL) 25 MG tablet    Sig: Take 0.5 tablets (12.5 mg total) by mouth daily.    Dispense:  45 tablet    Refill:  3    Follow-up: Return in about 6 months (around 11/30/2019) for Hypertension.    Beatrice Lecher, MD

## 2019-06-01 NOTE — Assessment & Plan Note (Signed)
Negative for hepatitis C virus itself so no active infection.

## 2019-06-01 NOTE — Assessment & Plan Note (Signed)
Currently happy with his over-the-counter supplement he has been on it for years and feels like it controls his symptoms.

## 2019-06-02 LAB — LIPID PANEL
Cholesterol: 155 mg/dL (ref <200)
HDL: 53 mg/dL (ref >=40)
LDL Cholesterol (Calc): 83 mg/dL (calc)
Non-HDL Cholesterol (Calc): 102 mg/dL (calc) (ref <130)
Total CHOL/HDL Ratio: 2.9 (calc) (ref <5.0)
Triglycerides: 93 mg/dL (ref <150)

## 2019-06-02 LAB — COMPLETE METABOLIC PANEL WITH GFR
AG Ratio: 1.3 (calc) (ref 1.0–2.5)
ALT: 10 U/L (ref 9–46)
AST: 25 U/L (ref 10–35)
Albumin: 4.3 g/dL (ref 3.6–5.1)
Alkaline phosphatase (APISO): 56 U/L (ref 35–144)
BUN: 18 mg/dL (ref 7–25)
CO2: 31 mmol/L (ref 20–32)
Calcium: 9.5 mg/dL (ref 8.6–10.3)
Chloride: 105 mmol/L (ref 98–110)
Creat: 1.05 mg/dL (ref 0.70–1.11)
GFR, Est African American: 75 mL/min/{1.73_m2} (ref >=60)
GFR, Est Non African American: 65 mL/min/{1.73_m2} (ref >=60)
Globulin: 3.2 g/dL (calc) (ref 1.9–3.7)
Glucose, Bld: 92 mg/dL (ref 65–99)
Potassium: 4.4 mmol/L (ref 3.5–5.3)
Sodium: 141 mmol/L (ref 135–146)
Total Bilirubin: 0.5 mg/dL (ref 0.2–1.2)
Total Protein: 7.5 g/dL (ref 6.1–8.1)

## 2019-06-02 NOTE — Progress Notes (Signed)
All labs are normal. 

## 2019-06-08 NOTE — Progress Notes (Signed)
Subjective:   Patrick Perry is a 85 y.o. male who presents for Medicare Annual/Subsequent preventive examination.  Review of Systems:  No ROS.  Medicare Wellness Virtual Visit.  Visual/audio telehealth visit, UTA vital signs.   See social history for additional risk factors.    Cardiac Risk Factors include: advanced age (>59men, >81 women);hypertension;male gender  Sleep patterns: Getting 7 hours of sleep a night. Wakes up 1 time a night to void. Wakes up and feels rested and ready for the day.   Home Safety/Smoke Alarms: Feels safe in home. Smoke alarms in place.  Living environment; Lives with wife in a 1 story home. Basement has stairs and steps have handrails on them. Shower is a walk in shower and grab bars in place. Seat Belt Safety/Bike Helmet: Wears seat belt.    Male:   CCS- Aged out    PSA-  UTD Lab Results  Component Value Date   PSA 2.8 05/26/2018   PSA 2.0 06/06/2016   PSA 2.42 06/01/2015        Objective:    Vitals: BP 125/65   Pulse (!) 58   Ht 5\' 9"  (1.753 m)   Wt 156 lb (70.8 kg)   SpO2 100%   BMI 23.04 kg/m   Body mass index is 23.04 kg/m.  Advanced Directives 06/15/2019 06/11/2018 08/10/2017 06/10/2017 05/25/2014  Does Patient Have a Medical Advance Directive? Yes Yes No Yes Yes  Type of Paramedic of [place];Living will [place];Living will - [place];Living will Living will;Healthcare Power of Attorney  Does patient want to make changes to medical advance directive? No - Patient declined No - Patient declined - - No - Patient declined  Copy of [place] in Chart? No - copy requested No - copy requested - - No - copy requested    Tobacco Social History   Tobacco Use  Smoking Status Never Smoker  Smokeless Tobacco Never Used     Counseling given: No   Clinical Intake:  Pre-visit preparation completed: Yes  Pain : No/denies pain     Nutritional  Risks: None Diabetes: No  How often do you need to have someone help you when you read instructions, pamphlets, or other written materials from your doctor or pharmacy?: 1 - Never What is the last grade level you completed in school?: 16     Information entered by :: Patrick Dakin, LPN  Past Medical History:  Diagnosis Date  . Cataract   . H/O colonoscopy with polypectomy 06/12/11   repeat in 5 years   . History of tonsillectomy 1946   Past Surgical History:  Procedure Laterality Date  . BUNIONECTOMY  10/07/2012   Hammer toe  . CATARACT EXTRACTION, BILATERAL  2009  . HEMORRHOID SURGERY    . HERNIA REPAIR    . REPAIR KNEE LIGAMENT  1978  . ROTATOR CUFF REPAIR  2008   left  . TONSILLECTOMY  1946   Family History  Problem Relation Age of Onset  . Prostate cancer Brother    Social History   Socioeconomic History  . Marital status: Married    Spouse name: Patrick Perry  . Number of children: 2  . Years of education: BA  . Highest education level: Bachelor's degree (e.g., BA, AB, BS)  Occupational History  . Occupation: Retired.    Comment: engineering  Tobacco Use  . Smoking status: Never Smoker  . Smokeless tobacco: Never Used  Substance and Sexual  Activity  . Alcohol use: No  . Drug use: No  . Sexual activity: Yes  Other Topics Concern  . Not on file  Social History Narrative   Works out 3 x per week, treadmill, home gym. No caffeine. Worked as an Chief Financial Officer.   Social Determinants of Health   Financial Resource Strain:   . Difficulty of Paying Living Expenses: Not on file  Food Insecurity:   . Worried About Charity fundraiser in the Last Year: Not on file  . Ran Out of Food in the Last Year: Not on file  Transportation Needs:   . Lack of Transportation (Medical): Not on file  . Lack of Transportation (Non-Medical): Not on file  Physical Activity:   . Days of Exercise per Week: Not on file  . Minutes of Exercise per Session: Not on file  Stress:   . Feeling of  Stress : Not on file  Social Connections:   . Frequency of Communication with Friends and Family: Not on file  . Frequency of Social Gatherings with Friends and Family: Not on file  . Attends Religious Services: Not on file  . Active Member of Clubs or Organizations: Not on file  . Attends Archivist Meetings: Not on file  . Marital Status: Not on file    Outpatient Encounter Medications as of 06/15/2019  Medication Sig  . b complex vitamins capsule Take 1 capsule by mouth daily.  . Cholecalciferol (VITAMIN D-3) 1000 UNITS CAPS Take by mouth 2 (two) times daily.  . Coenzyme Q10 (COQ10 PO) Take 5 mg by mouth daily.  Marland Kitchen GLUCOSAMINE-CHONDROITIN-CA-D3 PO Take 2,000 mg by mouth.  . hydrochlorothiazide (HYDRODIURIL) 25 MG tablet Take 0.5 tablets (12.5 mg total) by mouth daily.  Marland Kitchen lovastatin (MEVACOR) 40 MG tablet TAKE 1 TABLET AT BEDTIME  . Misc Natural Products (PROSTATE) CAPS Take 1 tablet by mouth 2 (two) times daily.  . vitamin C (ASCORBIC ACID) 500 MG tablet Take 500 mg by mouth daily.   No facility-administered encounter medications on file as of 06/15/2019.    Activities of Daily Living In your present state of health, do you have any difficulty performing the following activities: 06/15/2019  Hearing? Y  Comment hearing aids in both ears.  Vision? N  Difficulty concentrating or making decisions? N  Walking or climbing stairs? N  Dressing or bathing? N  Doing errands, shopping? N  Preparing Food and eating ? N  Using the Toilet? N  In the past six months, have you accidently leaked urine? N  Do you have problems with loss of bowel control? N  Managing your Medications? N  Managing your Finances? N  Housekeeping or managing your Housekeeping? N  Some recent data might be hidden    Patient Care Team: Patrick Marry, MD as PCP - General (Family Medicine) Patrick Perry, [place] (Optometry) Patrick Pigg, MD as Attending Physician (Dermatology)   Assessment:    This is a routine wellness examination for Patrick Perry.Physical assessment deferred to PCP.   Exercise Activities and Dietary recommendations Current Exercise Habits: Home exercise routine, Type of exercise: treadmill;strength training/weights, Time (Minutes): 45, Frequency (Times/Week): 5, Weekly Exercise (Minutes/Week): 225, Intensity: Moderate Diet Eats a healthy diet of vegetables and fruits and proteins. Drinks milk daily. Drinks water daily. Breakfast: Cereal with protein powder, banana and blueberries Lunch: Vegetables, meat or a salad with tea Dinner: Meat and vegetables with tea      Goals    . Exercise 3x per  week (30 min per time)     Exercise more than three times a week to help keep his muscles strong in his legs. Gym has silver sneakers but he has a gym at home.    . Patient Stated     Patient states maintain his exercise regiman and maintain his weight.       Fall Risk Fall Risk  06/15/2019 06/11/2018 12/09/2017 06/10/2017 12/05/2016  Falls in the past year? 0 0 Yes No No  Number falls in past yr: 0 - 1 - -  Injury with Fall? 0 - No - -  Risk for fall due to : - - Other (Comment) - -  Risk for fall due to: Comment - - tripped on debris  - -  Follow up Falls prevention discussed - Falls prevention discussed - -   Is the patient's home free of loose throw rugs in walkways, pet beds, electrical cords, etc?   yes      Grab bars in the bathroom? yes      Handrails on the stairs?   yes      Adequate lighting?   yes   Depression Screen PHQ 2/9 Scores 06/15/2019 11/25/2018 06/11/2018 12/09/2017  PHQ - 2 Score 0 0 0 0    Cognitive Function     6CIT Screen 06/15/2019 06/11/2018 06/10/2017 06/06/2016  What Year? 0 points 0 points 0 points 0 points  What month? 0 points 0 points 0 points 0 points  What time? 0 points 0 points 0 points 0 points  Count back from 20 0 points 0 points 0 points 0 points  Months in reverse 0 points 0 points 0 points 0 points  Repeat phrase 0  points 2 points 0 points 0 points  Total Score 0 2 0 0    Immunization History  Administered Date(s) Administered  . Influenza Split 03/19/2012  . Influenza, High Dose Seasonal PF 04/21/2013, 03/26/2017, 03/31/2018, 04/29/2019  . Influenza,inj,Quad PF,6+ Mos 04/07/2015  . Influenza-Unspecified 04/02/2016  . Pneumococcal Conjugate-13 05/25/2014  . Pneumococcal Polysaccharide-23 04/30/2006  . Tdap 05/01/2010  . Zoster 04/03/2006  . Zoster Recombinat (Shingrix) 04/29/2019    Screening Tests Health Maintenance  Topic Date Due  . TETANUS/TDAP  05/01/2020  . INFLUENZA VACCINE  Completed  . PNA vac Low Risk Adult  Completed      Plan:  Please schedule your next medicare wellness visit with me in 1 yr.  Mr. Cappello , Thank you for taking time to come for your Medicare Wellness Visit. I appreciate your ongoing commitment to your health goals. Please review the following plan we discussed and let me know if I can assist you in the future.  Continue doing brain stimulating activities (puzzles, reading, adult coloring books, staying active) to keep memory sharp.  Keep up the good work, you are doing a wonderful job with your health.  These are the goals we discussed: Goals    . Exercise 3x per week (30 min per time)     Exercise more than three times a week to help keep his muscles strong in his legs. Gym has silver sneakers but he has a gym at home.    . Patient Stated     Patient states maintain his exercise regiman and maintain his weight.       This is a list of the screening recommended for you and due dates:  Health Maintenance  Topic Date Due  . Tetanus Vaccine  05/01/2020  . Flu  Shot  Completed  . Pneumonia vaccines  Completed     I have personally reviewed and noted the following in the patient's chart:   . Medical and social history . Use of alcohol, tobacco or illicit drugs  . Current medications and supplements . Functional ability and status . Nutritional  status . Physical activity . Advanced directives . List of other physicians . Hospitalizations, surgeries, and ER visits in previous 12 months . Vitals . Screenings to include cognitive, depression, and falls . Referrals and appointments  In addition, I have reviewed and discussed with patient certain preventive protocols, quality metrics, and best practice recommendations. A written personalized care plan for preventive services as well as general preventive health recommendations were provided to patient.     Joanne Chars, LPN  [id number]

## 2019-06-15 ENCOUNTER — Ambulatory Visit (INDEPENDENT_AMBULATORY_CARE_PROVIDER_SITE_OTHER): Payer: Medicare HMO | Admitting: *Deleted

## 2019-06-15 VITALS — BP 125/65 | HR 58 | Ht 69.0 in | Wt 156.0 lb

## 2019-06-15 DIAGNOSIS — Z0000 Encounter for general adult medical examination without abnormal findings: Secondary | ICD-10-CM | POA: Diagnosis not present

## 2019-06-15 NOTE — Patient Instructions (Addendum)
Please schedule your next medicare wellness visit with me in 1 yr.  Patrick Perry , Thank you for taking time to come for your Medicare Wellness Visit. I appreciate your ongoing commitment to your health goals. Please review the following plan we discussed and let me know if I can assist you in the future.  Continue doing brain stimulating activities (puzzles, reading, adult coloring books, staying active) to keep memory sharp.  Keep up the good work, you are doing a wonderful job with your health. These are the goals we discussed: Goals    . Exercise 3x per week (30 min per time)     Exercise more than three times a week to help keep his muscles strong in his legs. Gym has silver sneakers but he has a gym at home.    . Patient Stated     Patient states maintain his exercise regiman and maintain his weight.

## 2019-11-30 ENCOUNTER — Ambulatory Visit (INDEPENDENT_AMBULATORY_CARE_PROVIDER_SITE_OTHER): Payer: Medicare HMO | Admitting: Family Medicine

## 2019-11-30 ENCOUNTER — Encounter: Payer: Self-pay | Admitting: Family Medicine

## 2019-11-30 VITALS — BP 126/72 | HR 58 | Ht 69.0 in | Wt 157.0 lb

## 2019-11-30 DIAGNOSIS — I10 Essential (primary) hypertension: Secondary | ICD-10-CM | POA: Diagnosis not present

## 2019-11-30 DIAGNOSIS — E7800 Pure hypercholesterolemia, unspecified: Secondary | ICD-10-CM | POA: Diagnosis not present

## 2019-11-30 DIAGNOSIS — R29898 Other symptoms and signs involving the musculoskeletal system: Secondary | ICD-10-CM | POA: Diagnosis not present

## 2019-11-30 DIAGNOSIS — S46211A Strain of muscle, fascia and tendon of other parts of biceps, right arm, initial encounter: Secondary | ICD-10-CM

## 2019-11-30 LAB — BASIC METABOLIC PANEL WITH GFR
BUN: 20 mg/dL (ref 7–25)
CO2: 29 mmol/L (ref 20–32)
Calcium: 9.4 mg/dL (ref 8.6–10.3)
Chloride: 108 mmol/L (ref 98–110)
Creat: 1.04 mg/dL (ref 0.70–1.11)
GFR, Est African American: 76 mL/min/{1.73_m2} (ref >=60)
GFR, Est Non African American: 65 mL/min/{1.73_m2} (ref >=60)
Glucose, Bld: 96 mg/dL (ref 65–99)
Potassium: 4.7 mmol/L (ref 3.5–5.3)
Sodium: 141 mmol/L (ref 135–146)

## 2019-11-30 NOTE — Progress Notes (Signed)
Established Patient Office Visit  Subjective:  Patient ID: Patrick Perry, male    DOB: 30-Sep-1932  Age: 86 y.o. MRN: [id number]  CC:  Chief Complaint  Patient presents with  . Hypertension    HPI Patrick Perry presents for   Hypertension- Pt denies chest pain, SOB, dizziness, or heart palpitations.  Taking meds as directed w/o problems.  Denies medication side effects.  He does workout on the treadmill  He has noticed he is been having low bit more problems with his ankles particularly his left ankle he has a lift built into his shoe on that left ankle because of some leg length discrepancy but has noticed that sometimes he will feel like his ankle wants to turn outward.  No recent injury or trauma.   Is also noticed a bulge in his right upper arm.  Again he works out regularly gets on the treadmill multiple days a week and he has been using an exercise machine to basically just do some resistance to help maintain his strength.  But about a month ago noticed a bulge in his bicep area on the right side.  He says is not really painful or bothersome and has not noticed any change in his strength.  Past Medical History:  Diagnosis Date  . Cataract   . H/O colonoscopy with polypectomy 06/12/11   repeat in 5 years   . History of tonsillectomy 1946    Past Surgical History:  Procedure Laterality Date  . BUNIONECTOMY  10/07/2012   Hammer toe  . CATARACT EXTRACTION, BILATERAL  2009  . HEMORRHOID SURGERY    . HERNIA REPAIR    . REPAIR KNEE LIGAMENT  1978  . ROTATOR CUFF REPAIR  2008   left  . TONSILLECTOMY  1946    Family History  Problem Relation Age of Onset  . Prostate cancer Brother     Social History   Socioeconomic History  . Marital status: Married    Spouse name: [name]  . Number of children: 2  . Years of education: BA  . Highest education level: Bachelor's degree (e.g., BA, AB, BS)  Occupational History  . Occupation: Retired.    Comment: engineering   Tobacco Use  . Smoking status: Never Smoker  . Smokeless tobacco: Never Used  Substance and Sexual Activity  . Alcohol use: No  . Drug use: No  . Sexual activity: Yes  Other Topics Concern  . Not on file  Social History Narrative   Works out 3 x per week, treadmill, home gym. No caffeine. Worked as an Chief Financial Officer.   Social Determinants of Health   Financial Resource Strain:   . Difficulty of Paying Living Expenses:   Food Insecurity:   . Worried About Charity fundraiser in the Last Year:   . Arboriculturist in the Last Year:   Transportation Needs:   . Film/video editor (Medical):   Marland Kitchen Lack of Transportation (Non-Medical):   Physical Activity:   . Days of Exercise per Week:   . Minutes of Exercise per Session:   Stress:   . Feeling of Stress :   Social Connections:   . Frequency of Communication with Friends and Family:   . Frequency of Social Gatherings with Friends and Family:   . Attends Religious Services:   . Active Member of Clubs or Organizations:   . Attends Archivist Meetings:   Marland Kitchen Marital Status:   Intimate Partner Violence:   .  Fear of Current or Ex-Partner:   . Emotionally Abused:   Marland Kitchen Physically Abused:   . Sexually Abused:     Outpatient Medications Prior to Visit  Medication Sig Dispense Refill  . b complex vitamins capsule Take 1 capsule by mouth daily.    . Cholecalciferol (VITAMIN D-3) 1000 UNITS CAPS Take by mouth 3 (three) times daily.     . Coenzyme Q10 (COQ10 PO) Take 5 mg by mouth daily.    Marland Kitchen GLUCOSAMINE-CHONDROITIN-CA-D3 PO Take 2,000 mg by mouth.    . hydrochlorothiazide (HYDRODIURIL) 25 MG tablet Take 0.5 tablets (12.5 mg total) by mouth daily. 45 tablet 3  . lovastatin (MEVACOR) 40 MG tablet TAKE 1 TABLET AT BEDTIME 90 tablet 3  . Misc Natural Products (PROSTATE) CAPS Take 1 tablet by mouth 2 (two) times daily.    . vitamin C (ASCORBIC ACID) 500 MG tablet Take 500 mg by mouth daily.    Marland Kitchen SHINGRIX injection      No  facility-administered medications prior to visit.    Allergies  Allergen Reactions  . Clindamycin/Lincomycin     rash    ROS Review of Systems    Objective:    Physical Exam  Constitutional: He is oriented to person, place, and time. He appears well-developed and well-nourished.  HENT:  Head: Normocephalic and atraumatic.  Cardiovascular: Normal rate, regular rhythm and normal heart sounds.  Pulmonary/Chest: Effort normal and breath sounds normal.  Musculoskeletal:     Comments: Does have a bulge where his right bicep should be except as a little bit closer to the antecubital fossa.  Nontender on exam.  She was soft.  Neurological: He is alert and oriented to person, place, and time.  Skin: Skin is warm and dry.  Psychiatric: He has a normal mood and affect. His behavior is normal.    BP 126/72   Pulse (!) 58   Ht 5\' 9"  (1.753 m)   Wt 157 lb (71.2 kg)   SpO2 100%   BMI 23.18 kg/m  Wt Readings from Last 3 Encounters:  11/30/19 157 lb (71.2 kg)  06/15/19 156 lb (70.8 kg)  06/01/19 157 lb (71.2 kg)     There are no preventive care reminders to display for this patient.  There are no preventive care reminders to display for this patient.  No results found for: TSH Lab Results  Component Value Date   WBC 6.0 05/09/2012   HGB 15.8 05/09/2012   HCT 45.8 05/09/2012   MCV 92.0 05/09/2012   PLT 240 05/09/2012   Lab Results  Component Value Date   NA 141 06/01/2019   K 4.4 06/01/2019   CO2 31 06/01/2019   GLUCOSE 92 06/01/2019   BUN 18 06/01/2019   CREATININE 1.05 06/01/2019   BILITOT 0.5 06/01/2019   ALKPHOS 54 06/06/2016   AST 25 06/01/2019   ALT 10 06/01/2019   PROT 7.5 06/01/2019   ALBUMIN 4.1 06/06/2016   CALCIUM 9.5 06/01/2019   Lab Results  Component Value Date   CHOL 155 06/01/2019   Lab Results  Component Value Date   HDL 53 06/01/2019   Lab Results  Component Value Date   LDLCALC 83 06/01/2019   Lab Results  Component Value Date    TRIG 93 06/01/2019   Lab Results  Component Value Date   CHOLHDL 2.9 06/01/2019   No results found for: HGBA1C    Assessment & Plan:   Problem List Items Addressed This Visit  Cardiovascular and Mediastinum   Essential hypertension, benign - Primary    Well controlled.  Due for BMP today.  Recheck in 6 months.      Relevant Orders   BASIC METABOLIC PANEL WITH GFR     Musculoskeletal and Integument   Tear of right biceps muscle    Explained to him that he has a torn right bicep causing the bulge.  Since is not really having any pain or problems or significant weakness then I do not think he needs any further intervention.  Unclear if it could be a full or partial tear.  Certainly if he develops any new symptoms then please let us know.        Other   Hyperlipidemia    Continue statin.  Last LDL was 83 in December.  Recheck labs in 6 months.       Other Visit Diagnoses    Ankle weakness          Ankle weakness-reviewed some home stretches that he can do on his own to strengthen his ankles.  Demonstrated how to do those today.  If he feels like it is getting worse or becoming more problematic then please let me know.  We also discussed maybe even wearing more of a lace up boot to provide a little bit more ankle support  Reminded him to schedule his Tdap for later this year at the pharmacy.  No orders of the defined types were placed in this encounter.   Follow-up: Return in about 6 months (around 05/31/2020) for Hypertension.    Beatrice Lecher, MD

## 2019-11-30 NOTE — Patient Instructions (Signed)
You are due for a tetanus vaccine in November. You will need to get this at your pharmacy.

## 2019-11-30 NOTE — Assessment & Plan Note (Signed)
Continue statin.  Last LDL was 83 in December.  Recheck labs in 6 months.

## 2019-11-30 NOTE — Assessment & Plan Note (Signed)
Well controlled.  Due for BMP today.  Recheck in 6 months.

## 2019-11-30 NOTE — Assessment & Plan Note (Signed)
Explained to him that he has a torn right bicep causing the bulge.  Since is not really having any pain or problems or significant weakness then I do not think he needs any further intervention.  Unclear if it could be a full or partial tear.  Certainly if he develops any new symptoms then please let us know.

## 2019-12-01 NOTE — Progress Notes (Signed)
All labs are normal. 

## 2019-12-02 ENCOUNTER — Other Ambulatory Visit: Payer: Self-pay | Admitting: Family Medicine

## 2020-01-14 DIAGNOSIS — D2261 Melanocytic nevi of right upper limb, including shoulder: Secondary | ICD-10-CM | POA: Diagnosis not present

## 2020-01-14 DIAGNOSIS — D220 Melanocytic nevi of lip: Secondary | ICD-10-CM | POA: Diagnosis not present

## 2020-01-14 DIAGNOSIS — D224 Melanocytic nevi of scalp and neck: Secondary | ICD-10-CM | POA: Diagnosis not present

## 2020-01-14 DIAGNOSIS — L723 Sebaceous cyst: Secondary | ICD-10-CM | POA: Diagnosis not present

## 2020-01-14 DIAGNOSIS — D2272 Melanocytic nevi of left lower limb, including hip: Secondary | ICD-10-CM | POA: Diagnosis not present

## 2020-01-14 DIAGNOSIS — D2262 Melanocytic nevi of left upper limb, including shoulder: Secondary | ICD-10-CM | POA: Diagnosis not present

## 2020-01-14 DIAGNOSIS — L821 Other seborrheic keratosis: Secondary | ICD-10-CM | POA: Diagnosis not present

## 2020-01-14 DIAGNOSIS — D2230 Melanocytic nevi of unspecified part of face: Secondary | ICD-10-CM | POA: Diagnosis not present

## 2020-01-14 DIAGNOSIS — D225 Melanocytic nevi of trunk: Secondary | ICD-10-CM | POA: Diagnosis not present

## 2020-03-31 DIAGNOSIS — R69 Illness, unspecified: Secondary | ICD-10-CM | POA: Diagnosis not present

## 2020-04-14 DIAGNOSIS — R69 Illness, unspecified: Secondary | ICD-10-CM | POA: Diagnosis not present

## 2020-04-26 DIAGNOSIS — R69 Illness, unspecified: Secondary | ICD-10-CM | POA: Diagnosis not present

## 2020-05-04 DIAGNOSIS — H40011 Open angle with borderline findings, low risk, right eye: Secondary | ICD-10-CM | POA: Diagnosis not present

## 2020-05-04 DIAGNOSIS — H524 Presbyopia: Secondary | ICD-10-CM | POA: Diagnosis not present

## 2020-05-31 ENCOUNTER — Other Ambulatory Visit: Payer: Self-pay

## 2020-05-31 ENCOUNTER — Ambulatory Visit (INDEPENDENT_AMBULATORY_CARE_PROVIDER_SITE_OTHER): Payer: Medicare HMO | Admitting: Family Medicine

## 2020-05-31 ENCOUNTER — Encounter: Payer: Self-pay | Admitting: Family Medicine

## 2020-05-31 VITALS — BP 124/66 | HR 54 | Ht 69.0 in | Wt 162.0 lb

## 2020-05-31 DIAGNOSIS — N401 Enlarged prostate with lower urinary tract symptoms: Secondary | ICD-10-CM

## 2020-05-31 DIAGNOSIS — E7800 Pure hypercholesterolemia, unspecified: Secondary | ICD-10-CM | POA: Diagnosis not present

## 2020-05-31 DIAGNOSIS — I10 Essential (primary) hypertension: Secondary | ICD-10-CM | POA: Diagnosis not present

## 2020-05-31 MED ORDER — AMBULATORY NON FORMULARY MEDICATION: 0 refills | Status: DC

## 2020-05-31 MED ORDER — HYDROCHLOROTHIAZIDE 25 MG PO TABS: 12.5000 mg | ORAL_TABLET | Freq: Every day | ORAL | 3 refills | Status: DC

## 2020-05-31 NOTE — Assessment & Plan Note (Signed)
Well controlled. Continue current regimen. Follow up in  6 mo. Due for labs.  

## 2020-05-31 NOTE — Assessment & Plan Note (Signed)
Stable

## 2020-05-31 NOTE — Assessment & Plan Note (Addendum)
Stable on current regimen due for updated lab work.

## 2020-05-31 NOTE — Progress Notes (Signed)
Established Patient Office Visit  Subjective:  Patient ID: Patrick Perry, male    DOB: 10-05-1932  Age: 86 y.o. MRN: [id number]  CC:  Chief Complaint  Patient presents with  . Hypertension    HPI Patrick Perry presents for   Hypertension- Pt denies chest pain, SOB, dizziness, or heart palpitations.  Taking meds as directed w/o problems.  Denies medication side effects.  He exercises regular.    F/U BPH -stable on current regimen.  Follow-up hyperlipidemia-he has been on Mevacor for quite some time and is doing well with it no problems or side effects.   Past Medical History:  Diagnosis Date  . Cataract   . H/O colonoscopy with polypectomy 06/12/11   repeat in 5 years   . History of tonsillectomy 1946    Past Surgical History:  Procedure Laterality Date  . BUNIONECTOMY  10/07/2012   Hammer toe  . CATARACT EXTRACTION, BILATERAL  2009  . HEMORRHOID SURGERY    . HERNIA REPAIR    . REPAIR KNEE LIGAMENT  1978  . ROTATOR CUFF REPAIR  2008   left  . TONSILLECTOMY  1946    Family History  Problem Relation Age of Onset  . Prostate cancer Brother     Social History   Socioeconomic History  . Marital status: Married    Spouse name: [name]  . Number of children: 2  . Years of education: BA  . Highest education level: Bachelor's degree (e.g., BA, AB, BS)  Occupational History  . Occupation: Retired.    Comment: engineering  Tobacco Use  . Smoking status: Never Smoker  . Smokeless tobacco: Never Used  Vaping Use  . Vaping Use: Never used  Substance and Sexual Activity  . Alcohol use: No  . Drug use: No  . Sexual activity: Yes  Other Topics Concern  . Not on file  Social History Narrative   Works out 3 x per week, treadmill, home gym. No caffeine. Worked as an Chief Financial Officer.   Social Determinants of Health   Financial Resource Strain:   . Difficulty of Paying Living Expenses: Not on file  Food Insecurity:   . Worried About Charity fundraiser in the Last  Year: Not on file  . Ran Out of Food in the Last Year: Not on file  Transportation Needs:   . Lack of Transportation (Medical): Not on file  . Lack of Transportation (Non-Medical): Not on file  Physical Activity:   . Days of Exercise per Week: Not on file  . Minutes of Exercise per Session: Not on file  Stress:   . Feeling of Stress : Not on file  Social Connections:   . Frequency of Communication with Friends and Family: Not on file  . Frequency of Social Gatherings with Friends and Family: Not on file  . Attends Religious Services: Not on file  . Active Member of Clubs or Organizations: Not on file  . Attends Archivist Meetings: Not on file  . Marital Status: Not on file  Intimate Partner Violence:   . Fear of Current or Ex-Partner: Not on file  . Emotionally Abused: Not on file  . Physically Abused: Not on file  . Sexually Abused: Not on file    Outpatient Medications Prior to Visit  Medication Sig Dispense Refill  . b complex vitamins capsule Take 1 capsule by mouth daily.    . Cholecalciferol (VITAMIN D-3) 1000 UNITS CAPS Take by mouth 3 (three) times  daily.     . Coenzyme Q10 (COQ10 PO) Take 5 mg by mouth daily.    Marland Kitchen GLUCOSAMINE-CHONDROITIN-CA-D3 PO Take 2,000 mg by mouth.    . lovastatin (MEVACOR) 40 MG tablet TAKE 1 TABLET BY MOUTH EVERYDAY AT BEDTIME 90 tablet 3  . Misc Natural Products (PROSTATE) CAPS Take 1 tablet by mouth 2 (two) times daily.    . vitamin C (ASCORBIC ACID) 500 MG tablet Take 500 mg by mouth daily.    . hydrochlorothiazide (HYDRODIURIL) 25 MG tablet Take 0.5 tablets (12.5 mg total) by mouth daily. 45 tablet 3   No facility-administered medications prior to visit.    Allergies  Allergen Reactions  . Clindamycin/Lincomycin     rash    ROS Review of Systems    Objective:    Physical Exam Constitutional:      Appearance: He is well-developed.  HENT:     Head: Normocephalic and atraumatic.  Cardiovascular:     Rate and Rhythm:  Normal rate and regular rhythm.     Heart sounds: Normal heart sounds.  Pulmonary:     Effort: Pulmonary effort is normal.     Breath sounds: Normal breath sounds.  Skin:    General: Skin is warm and dry.  Neurological:     Mental Status: He is alert and oriented to person, place, and time.  Psychiatric:        Behavior: Behavior normal.     BP 124/66   Pulse (!) 54   Ht 5\' 9"  (1.753 m)   Wt 162 lb (73.5 kg)   SpO2 100%   BMI 23.92 kg/m  Wt Readings from Last 3 Encounters:  05/31/20 162 lb (73.5 kg)  11/30/19 157 lb (71.2 kg)  06/15/19 156 lb (70.8 kg)     Health Maintenance Due  Topic Date Due  . TETANUS/TDAP  05/01/2020    There are no preventive care reminders to display for this patient.  No results found for: TSH Lab Results  Component Value Date   WBC 6.0 05/09/2012   HGB 15.8 05/09/2012   HCT 45.8 05/09/2012   MCV 92.0 05/09/2012   PLT 240 05/09/2012   Lab Results  Component Value Date   NA 141 11/30/2019   K 4.7 11/30/2019   CO2 29 11/30/2019   GLUCOSE 96 11/30/2019   BUN 20 11/30/2019   CREATININE 1.04 11/30/2019   BILITOT 0.5 06/01/2019   ALKPHOS 54 06/06/2016   AST 25 06/01/2019   ALT 10 06/01/2019   PROT 7.5 06/01/2019   ALBUMIN 4.1 06/06/2016   CALCIUM 9.4 11/30/2019   Lab Results  Component Value Date   CHOL 155 06/01/2019   Lab Results  Component Value Date   HDL 53 06/01/2019   Lab Results  Component Value Date   LDLCALC 83 06/01/2019   Lab Results  Component Value Date   TRIG 93 06/01/2019   Lab Results  Component Value Date   CHOLHDL 2.9 06/01/2019   No results found for: HGBA1C    Assessment & Plan:   Problem List Items Addressed This Visit      Cardiovascular and Mediastinum   Essential hypertension, benign - Primary    Well controlled. Continue current regimen. Follow up in  6 mo. Due for labs      Relevant Medications   hydrochlorothiazide (HYDRODIURIL) 25 MG tablet   Other Relevant Orders   Lipid  panel   COMPLETE METABOLIC PANEL WITH GFR   PSA  Genitourinary   BPH (benign prostatic hyperplasia)    Stable.       Relevant Orders   PSA     Other   Hyperlipidemia    Stable on current regimen due for updated lab work.      Relevant Medications   hydrochlorothiazide (HYDRODIURIL) 25 MG tablet   Other Relevant Orders   Lipid panel   COMPLETE METABOLIC PANEL WITH GFR   PSA      He is due for a Tdap so give him a printed prescription so that he can get this done at the pharmacy through his Medicare part D  Meds ordered this encounter  Medications  . hydrochlorothiazide (HYDRODIURIL) 25 MG tablet    Sig: Take 0.5 tablets (12.5 mg total) by mouth daily.    Dispense:  45 tablet    Refill:  3  . AMBULATORY NON FORMULARY MEDICATION    Sig: Medication Name: Tdap IM x 1    Dispense:  1 Units    Refill:  0    Follow-up: Return in about 6 months (around 11/29/2020) for Hypertension.      Beatrice Lecher, MD

## 2020-06-01 LAB — COMPLETE METABOLIC PANEL WITH GFR
AG Ratio: 1.4 (calc) (ref 1.0–2.5)
ALT: 6 U/L — ABNORMAL LOW (ref 9–46)
AST: 21 U/L (ref 10–35)
Albumin: 4.1 g/dL (ref 3.6–5.1)
Alkaline phosphatase (APISO): 64 U/L (ref 35–144)
BUN: 20 mg/dL (ref 7–25)
CO2: 31 mmol/L (ref 20–32)
Calcium: 9.2 mg/dL (ref 8.6–10.3)
Chloride: 105 mmol/L (ref 98–110)
Creat: 0.98 mg/dL (ref 0.70–1.11)
GFR, Est African American: 81 mL/min/{1.73_m2} (ref >=60)
GFR, Est Non African American: 70 mL/min/{1.73_m2} (ref >=60)
Globulin: 2.9 g/dL (calc) (ref 1.9–3.7)
Glucose, Bld: 83 mg/dL (ref 65–99)
Potassium: 4.3 mmol/L (ref 3.5–5.3)
Sodium: 141 mmol/L (ref 135–146)
Total Bilirubin: 0.4 mg/dL (ref 0.2–1.2)
Total Protein: 7 g/dL (ref 6.1–8.1)

## 2020-06-01 LAB — LIPID PANEL
Cholesterol: 144 mg/dL (ref <200)
HDL: 50 mg/dL (ref >=40)
LDL Cholesterol (Calc): 80 mg/dL (calc)
Non-HDL Cholesterol (Calc): 94 mg/dL (calc) (ref <130)
Total CHOL/HDL Ratio: 2.9 (calc) (ref <5.0)
Triglycerides: 68 mg/dL (ref <150)

## 2020-06-01 LAB — PSA: PSA: 3.53 ng/mL (ref <=4.0)

## 2020-06-15 ENCOUNTER — Ambulatory Visit (INDEPENDENT_AMBULATORY_CARE_PROVIDER_SITE_OTHER): Payer: Medicare HMO | Admitting: Osteopathic Medicine

## 2020-06-15 VITALS — BP 131/51 | HR 64 | Temp 97.9°F | Resp 18 | Ht 69.0 in | Wt 162.0 lb

## 2020-06-15 DIAGNOSIS — Z0000 Encounter for general adult medical examination without abnormal findings: Secondary | ICD-10-CM | POA: Diagnosis not present

## 2020-06-15 NOTE — Progress Notes (Addendum)
Subjective:   Patrick Perry is a 86 y.o. male who presents for Medicare Annual/Subsequent preventive examination.  Review of Systems    Reports eating 3 meals a day on a regular diet. Cardiac Risk Factors include: advanced age (>555men, 48>65 women);male gender     Objective:    Today's Vitals   06/15/20 0857  BP: (!) 131/51  Pulse: 64  Resp: 18  Temp: 97.9 F (36.6 C)  SpO2: 99%  Weight: 162 lb (73.5 kg)  Height: 5\' 9"  (1.753 m)   Body mass index is 23.92 kg/m.  Advanced Directives 06/15/2020 06/15/2019 06/11/2018 08/10/2017 06/10/2017 05/25/2014  Does Patient Have a Medical Advance Directive? Yes Yes Yes No Yes Yes  Type of Estate agentAdvance Directive Healthcare Power of [place];Living will Healthcare Power of [place];Living will Healthcare Power of [place];Living will - Healthcare Power of [place];Living will Living will;Healthcare Power of Attorney  Does patient want to make changes to medical advance directive? No - Patient declined No - Patient declined No - Patient declined - - No - Patient declined  Copy of Healthcare Power of Attorney in Chart? Yes - validated most recent copy scanned in chart (See row information) No - copy requested No - copy requested - - No - copy requested  Would patient like information on creating a medical advance directive? No - Patient declined - - - - -    Current Medications (verified) Outpatient Encounter Medications as of 06/15/2020  Medication Sig  . b complex vitamins capsule Take 1 capsule by mouth daily.  . Cholecalciferol (VITAMIN D-3) 1000 UNITS CAPS Take by mouth 3 (three) times daily.   . Coenzyme Q10 (COQ10 PO) Take 5 mg by mouth daily.  Marland Kitchen. GLUCOSAMINE-CHONDROITIN-CA-D3 PO Take 2,000 mg by mouth.  . hydrochlorothiazide (HYDRODIURIL) 25 MG tablet Take 0.5 tablets (12.5 mg total) by mouth daily.  Marland Kitchen. lovastatin (MEVACOR) 40 MG tablet TAKE 1 TABLET BY MOUTH EVERYDAY AT BEDTIME  . Misc Natural Products (PROSTATE) CAPS Take 1 tablet by  mouth 2 (two) times daily.  . vitamin C (ASCORBIC ACID) 500 MG tablet Take 500 mg by mouth daily.  . AMBULATORY NON FORMULARY MEDICATION Medication Name: Tdap IM x 1 (Patient not taking: Reported on 06/15/2020)   No facility-administered encounter medications on file as of 06/15/2020.    Allergies (verified) Clindamycin/lincomycin   History: Past Medical History:  Diagnosis Date  . Cataract   . H/O colonoscopy with polypectomy 06/12/11   repeat in 5 years   . History of tonsillectomy 1946   Past Surgical History:  Procedure Laterality Date  . BUNIONECTOMY  10/07/2012   Hammer toe  . CATARACT EXTRACTION, BILATERAL  2009  . HEMORRHOID SURGERY    . REPAIR KNEE LIGAMENT  1978  . ROTATOR CUFF REPAIR  2008   left  . TONSILLECTOMY  1946   Family History  Problem Relation Age of Onset  . Prostate cancer Brother   . Cancer Sister   . Multiple sclerosis Sister    Social History   Socioeconomic History  . Marital status: Married    Spouse name: Dewayne Hatchnn  . Number of children: 2  . Years of education: BA  . Highest education level: Bachelor's degree (e.g., BA, AB, BS)  Occupational History  . Occupation: Retired.    Comment: engineering  Tobacco Use  . Smoking status: Never Smoker  . Smokeless tobacco: Never Used  Vaping Use  . Vaping Use: Never used  Substance and Sexual Activity  . Alcohol use:  No  . Drug use: No  . Sexual activity: Yes  Other Topics Concern  . Not on file  Social History Narrative   Works out 3 x per week, treadmill, home gym. Works on the yard a lot. No caffeine. Worked as an Chief Financial Officer.   Social Determinants of Health   Financial Resource Strain: Low Risk   . Difficulty of Paying Living Expenses: Not hard at all  Food Insecurity: No Food Insecurity  . Worried About Charity fundraiser in the Last Year: Never true  . Ran Out of Food in the Last Year: Never true  Transportation Needs: No Transportation Needs  . Lack of Transportation (Medical): No   . Lack of Transportation (Non-Medical): No  Physical Activity: Insufficiently Active  . Days of Exercise per Week: 3 days  . Minutes of Exercise per Session: 30 min  Stress: No Stress Concern Present  . Feeling of Stress : Not at all  Social Connections: Socially Integrated  . Frequency of Communication with Friends and Family: More than three times a week  . Frequency of Social Gatherings with Friends and Family: Three times a week  . Attends Religious Services: More than 4 times per year  . Active Member of Clubs or Organizations: Yes  . Attends Archivist Meetings: More than 4 times per year  . Marital Status: Married    Tobacco Counseling Counseling given: Not Answered   Clinical Intake:  Pre-visit preparation completed: Yes  Pain : No/denies pain     BMI - recorded: 23.92 Nutritional Status: BMI of 19-24  Normal Nutritional Risks: None Diabetes: No  How often do you need to have someone help you when you read instructions, pamphlets, or other written materials from your doctor or pharmacy?: 1 - Never What is the last grade level you completed in school?: College Degree  Diabetic? no  Interpreter Needed?: No  Information entered by :: Tinnie Gens, RN   Activities of Daily Living In your present state of health, do you have any difficulty performing the following activities: 06/15/2020 06/15/2020  Hearing? - Y  Comment - Bilateral Hearing aids  Vision? N N  Difficulty concentrating or making decisions? N -  Walking or climbing stairs? N -  Dressing or bathing? N -  Doing errands, shopping? N -  Preparing Food and eating ? N -  Using the Toilet? N -  In the past six months, have you accidently leaked urine? N -  Do you have problems with loss of bowel control? N -  Managing your Medications? N -  Managing your Finances? N -  Housekeeping or managing your Housekeeping? N -  Some recent data might be hidden    Patient Care Team: Hali Marry, MD as PCP - General (Family Medicine) Renelda Loma, [place] (Optometry) Jari Pigg, MD as Attending Physician (Dermatology)  Indicate any recent Medical Services you may have received from other than [facility] providers in the past year (date may be approximate).     Assessment:   This is a routine wellness examination for Patrick Perry.  Hearing/Vision screen No exam data present  Dietary issues and exercise activities discussed: Current Exercise Habits: Home exercise routine, Type of exercise: treadmill;Other - see comments (yard work), Time (Minutes): 30, Frequency (Times/Week): 5, Weekly Exercise (Minutes/Week): 150, Intensity: Moderate, Exercise limited by: None identified  Goals    . Exercise 3x per week (30 min per time)     Exercise more than three times a  week to help keep his muscles strong in his legs. Gym has silver sneakers but he has a gym at home.    . Patient Stated     Patient states maintain his exercise regiman and maintain his weight.      Depression Screen PHQ 2/9 Scores 06/15/2020 05/31/2020 06/15/2019 11/25/2018 06/11/2018 12/09/2017 06/10/2017  PHQ - 2 Score 0 0 0 0 0 0 0    Fall Risk Fall Risk  06/15/2020 11/30/2019 06/15/2019 06/11/2018 12/09/2017  Falls in the past year? 0 - 0 0 Yes  Number falls in past yr: 0 - 0 - 1  Injury with Fall? 0 - 0 - No  Risk for fall due to : No Fall Risks No Fall Risks - - Other (Comment)  Risk for fall due to: Comment - - - - tripped on debris   Follow up Falls evaluation completed - Falls prevention discussed - Falls prevention discussed    FALL RISK PREVENTION PERTAINING TO THE HOME:  Any stairs in or around the home? Yes  If so, are there any without handrails? Yes  Home free of loose throw rugs in walkways, pet beds, electrical cords, etc? Yes  Adequate lighting in your home to reduce risk of falls? Yes   Cognitive Function:     6CIT Screen 06/15/2020 06/15/2019 06/11/2018 06/10/2017 06/06/2016  What Year? 0  points 0 points 0 points 0 points 0 points  What month? 0 points 0 points 0 points 0 points 0 points  What time? 0 points 0 points 0 points 0 points 0 points  Count back from 20 0 points 0 points 0 points 0 points 0 points  Months in reverse 0 points 0 points 0 points 0 points 0 points  Repeat phrase 0 points 0 points 2 points 0 points 0 points  Total Score 0 0 2 0 0    Immunizations Immunization History  Administered Date(s) Administered  . Fluad Quad(high Dose 65+) 04/26/2020  . Influenza Split 03/19/2012  . Influenza, High Dose Seasonal PF 04/21/2013, 03/26/2017, 03/31/2018, 04/29/2019  . Influenza,inj,Quad PF,6+ Mos 04/07/2015  . Influenza-Unspecified 04/02/2016  . PFIZER SARS-COV-2 Vaccination 07/14/2019, 08/04/2019, 04/26/2020  . Pneumococcal Conjugate-13 05/25/2014  . Pneumococcal Polysaccharide-23 04/30/2006  . Tdap 05/01/2010, 06/02/2020  . Zoster 04/03/2006  . Zoster Recombinat (Shingrix) 04/29/2019, 06/29/2019    TDAP status: Up to date  Flu Vaccine status: Up to date  Pneumococcal vaccine status: Up to date  Covid-19 vaccine status: Information provided on how to obtain vaccines.   Qualifies for Shingles Vaccine? Yes   Zostavax completed Yes   Shingrix Completed?: Yes  Screening Tests Health Maintenance  Topic Date Due  . TETANUS/TDAP  06/02/2030  . INFLUENZA VACCINE  Completed  . COVID-19 Vaccine  Completed  . PNA vac Low Risk Adult  Completed    Health Maintenance  There are no preventive care reminders to display for this patient.  Colorectal cancer screening: No longer required.   Lung Cancer Screening: (Low Dose CT Chest recommended if Age 87-80 years, 30 pack-year currently smoking OR have quit w/in 15years.) does not qualify.   Lung Cancer Screening Referral:   Additional Screening:  Hepatitis C Screening: does not qualify; Completed   Vision Screening: Recommended annual ophthalmology exams for early detection of glaucoma and other  disorders of the eye. Is the patient up to date with their annual eye exam?  Yes  Who is the provider or what is the name of the office in which the patient  attends annual eye exams?  If pt is not established with a provider, would they like to be referred to a provider to establish care? No .   Dental Screening: Recommended annual dental exams for proper oral hygiene  Community Resource Referral / Chronic Care Management: CRR required this visit?  No   CCM required this visit?  No      Plan:     I have personally reviewed and noted the following in the patient's chart:   . Medical and social history . Use of alcohol, tobacco or illicit drugs  . Current medications and supplements . Functional ability and status . Nutritional status . Physical activity . Advanced directives . List of other physicians . Hospitalizations, surgeries, and ER visits in previous 12 months . Vitals . Screenings to include cognitive, depression, and falls . Referrals and appointments  In addition, I have reviewed and discussed with patient certain preventive protocols, quality metrics, and best practice recommendations. A written personalized care plan for preventive services as well as general preventive health recommendations were provided to patient.     Tinnie Gens, RN   06/15/2020   Nurse Notes: Follow up with Dr. Madilyn Fireman as scheduled.

## 2020-06-15 NOTE — Patient Instructions (Signed)

## 2020-07-04 NOTE — Addendum Note (Signed)
Addended by: Tinnie Gens on: 07/04/2020 10:58 AM   Modules accepted: Miquel Dunn

## 2020-07-22 ENCOUNTER — Ambulatory Visit (INDEPENDENT_AMBULATORY_CARE_PROVIDER_SITE_OTHER): Payer: Medicare HMO | Admitting: Family Medicine

## 2020-07-22 ENCOUNTER — Encounter: Payer: Self-pay | Admitting: Family Medicine

## 2020-07-22 ENCOUNTER — Ambulatory Visit (INDEPENDENT_AMBULATORY_CARE_PROVIDER_SITE_OTHER): Payer: Medicare HMO

## 2020-07-22 ENCOUNTER — Other Ambulatory Visit: Payer: Self-pay

## 2020-07-22 VITALS — BP 140/70 | HR 64 | Ht 69.0 in | Wt 162.0 lb

## 2020-07-22 DIAGNOSIS — M25511 Pain in right shoulder: Secondary | ICD-10-CM | POA: Diagnosis not present

## 2020-07-22 DIAGNOSIS — S8992XA Unspecified injury of left lower leg, initial encounter: Secondary | ICD-10-CM | POA: Diagnosis not present

## 2020-07-22 DIAGNOSIS — R2242 Localized swelling, mass and lump, left lower limb: Secondary | ICD-10-CM

## 2020-07-22 DIAGNOSIS — W010XXA Fall on same level from slipping, tripping and stumbling without subsequent striking against object, initial encounter: Secondary | ICD-10-CM | POA: Diagnosis not present

## 2020-07-22 DIAGNOSIS — M25462 Effusion, left knee: Secondary | ICD-10-CM | POA: Diagnosis not present

## 2020-07-22 DIAGNOSIS — W000XXA Fall on same level due to ice and snow, initial encounter: Secondary | ICD-10-CM | POA: Diagnosis not present

## 2020-07-22 DIAGNOSIS — M25562 Pain in left knee: Secondary | ICD-10-CM | POA: Diagnosis not present

## 2020-07-22 DIAGNOSIS — M1712 Unilateral primary osteoarthritis, left knee: Secondary | ICD-10-CM | POA: Diagnosis not present

## 2020-07-22 DIAGNOSIS — M19011 Primary osteoarthritis, right shoulder: Secondary | ICD-10-CM | POA: Diagnosis not present

## 2020-07-22 DIAGNOSIS — M7989 Other specified soft tissue disorders: Secondary | ICD-10-CM | POA: Diagnosis not present

## 2020-07-22 NOTE — Progress Notes (Signed)
Pt slipped on black ice yesterday he hit his L knee and R shoulder.

## 2020-07-22 NOTE — Progress Notes (Signed)
Acute Office Visit  Subjective:    Patient ID: Patrick Perry, male    DOB: [birth year], 87 y.o.   MRN: [id number]  Chief Complaint  Patient presents with  . Fall    HPI Patient is in today for fall that occurred yesterday. Was trying to feed the birds and hit his left knee and right shoulder.  Having hard time lifting his right shoulder.  No bruising.  Using ice on his shoulder.  He is able to walk but has been using his cane because of the discomfort in his left knee it is worse when he walks.  It has felt a little bit sore and swollen and tight.  He feels like the pain sometimes radiates down towards that left ankle.  In regards to his right shoulder he is having difficulty actually lifting the arm.  He says it just feels weak and heavy.  He has had some pain but he says really if he does not move it he has no pain.  He has been mostly using heat and ice and alternating.    Wife is here with him today for the visit.  Past Medical History:  Diagnosis Date  . Cataract   . H/O colonoscopy with polypectomy 06/12/11   repeat in 5 years   . History of tonsillectomy 1946    Past Surgical History:  Procedure Laterality Date  . BUNIONECTOMY  10/07/2012   Hammer toe  . CATARACT EXTRACTION, BILATERAL  2009  . HEMORRHOID SURGERY    . REPAIR KNEE LIGAMENT  1978  . ROTATOR CUFF REPAIR  2008   left  . TONSILLECTOMY  1946    Family History  Problem Relation Age of Onset  . Prostate cancer Brother   . Cancer Sister   . Multiple sclerosis Sister     Social History   Socioeconomic History  . Marital status: Married    Spouse name: [name]  . Number of children: 2  . Years of education: BA  . Highest education level: Bachelor's degree (e.g., BA, AB, BS)  Occupational History  . Occupation: Retired.    Comment: engineering  Tobacco Use  . Smoking status: Never Smoker  . Smokeless tobacco: Never Used  Vaping Use  . Vaping Use: Never used  Substance and Sexual Activity  .  Alcohol use: No  . Drug use: No  . Sexual activity: Yes  Other Topics Concern  . Not on file  Social History Narrative   Works out 3 x per week, treadmill, home gym. Works on the yard a lot. No caffeine. Worked as an Chief Financial Officer.   Social Determinants of Health   Financial Resource Strain: Low Risk   . Difficulty of Paying Living Expenses: Not hard at all  Food Insecurity: No Food Insecurity  . Worried About Charity fundraiser in the Last Year: Never true  . Ran Out of Food in the Last Year: Never true  Transportation Needs: No Transportation Needs  . Lack of Transportation (Medical): No  . Lack of Transportation (Non-Medical): No  Physical Activity: Insufficiently Active  . Days of Exercise per Week: 3 days  . Minutes of Exercise per Session: 30 min  Stress: No Stress Concern Present  . Feeling of Stress : Not at all  Social Connections: Socially Integrated  . Frequency of Communication with Friends and Family: More than three times a week  . Frequency of Social Gatherings with Friends and Family: Three times a week  . Attends Religious  Services: More than 4 times per year  . Active Member of Clubs or Organizations: Yes  . Attends Archivist Meetings: More than 4 times per year  . Marital Status: Married  Human resources officer Violence: Not At Risk  . Fear of Current or Ex-Partner: No  . Emotionally Abused: No  . Physically Abused: No  . Sexually Abused: No    Outpatient Medications Prior to Visit  Medication Sig Dispense Refill  . b complex vitamins capsule Take 1 capsule by mouth daily.    . Cholecalciferol (VITAMIN D-3) 1000 UNITS CAPS Take by mouth 3 (three) times daily.     . Coenzyme Q10 (COQ10 PO) Take 5 mg by mouth daily.    Marland Kitchen GLUCOSAMINE-CHONDROITIN-CA-D3 PO Take 2,000 mg by mouth.    . hydrochlorothiazide (HYDRODIURIL) 25 MG tablet Take 0.5 tablets (12.5 mg total) by mouth daily. 45 tablet 3  . lovastatin (MEVACOR) 40 MG tablet TAKE 1 TABLET BY MOUTH EVERYDAY  AT BEDTIME 90 tablet 3  . Misc Natural Products (PROSTATE) CAPS Take 1 tablet by mouth 2 (two) times daily.    . vitamin C (ASCORBIC ACID) 500 MG tablet Take 500 mg by mouth daily.    . AMBULATORY NON FORMULARY MEDICATION Medication Name: Tdap IM x 1 (Patient not taking: Reported on 06/15/2020) 1 Units 0   No facility-administered medications prior to visit.    Allergies  Allergen Reactions  . Clindamycin/Lincomycin     rash    Review of Systems     Objective:    Physical Exam Vitals reviewed.  Constitutional:      Appearance: He is well-developed and well-nourished.  HENT:     Head: Normocephalic and atraumatic.  Eyes:     Extraocular Movements: EOM normal.     Conjunctiva/sclera: Conjunctivae normal.  Cardiovascular:     Rate and Rhythm: Normal rate.  Pulmonary:     Effort: Pulmonary effort is normal.  Musculoskeletal:     Comments: Right shoulder is nontender on exam.  He is tender laterally over that upper arm over the deltoid area.  No significant swelling or bruising.  Left knee is mildly swollen superiorly.  He is tender over the patellar tendon and the superior medial edge of the tibia.  Does have prior deformity of that knee.  Old incision is well-healed.  + warm to touch.    Skin:    General: Skin is dry.     Coloration: Skin is not pale.  Neurological:     Mental Status: He is alert and oriented to person, place, and time.  Psychiatric:        Mood and Affect: Mood and affect normal.        Behavior: Behavior normal.     BP 140/70   Pulse 64   Ht 5\' 9"  (1.753 m)   Wt 162 lb (73.5 kg)   SpO2 98%   BMI 23.92 kg/m  Wt Readings from Last 3 Encounters:  07/22/20 162 lb (73.5 kg)  06/15/20 162 lb (73.5 kg)  05/31/20 162 lb (73.5 kg)    There are no preventive care reminders to display for this patient.  There are no preventive care reminders to display for this patient.   No results found for: TSH Lab Results  Component Value Date   WBC 6.0  05/09/2012   HGB 15.8 05/09/2012   HCT 45.8 05/09/2012   MCV 92.0 05/09/2012   PLT 240 05/09/2012   Lab Results  Component Value Date  NA 141 05/31/2020   K 4.3 05/31/2020   CO2 31 05/31/2020   GLUCOSE 83 05/31/2020   BUN 20 05/31/2020   CREATININE 0.98 05/31/2020   BILITOT 0.4 05/31/2020   ALKPHOS 54 06/06/2016   AST 21 05/31/2020   ALT 6 (L) 05/31/2020   PROT 7.0 05/31/2020   ALBUMIN 4.1 06/06/2016   CALCIUM 9.2 05/31/2020   Lab Results  Component Value Date   CHOL 144 05/31/2020   Lab Results  Component Value Date   HDL 50 05/31/2020   Lab Results  Component Value Date   LDLCALC 80 05/31/2020   Lab Results  Component Value Date   TRIG 68 05/31/2020   Lab Results  Component Value Date   CHOLHDL 2.9 05/31/2020   No results found for: HGBA1C     Assessment & Plan:   Problem List Items Addressed This Visit   None   Visit Diagnoses    Acute pain of left knee    -  Primary   Relevant Orders   DG Shoulder Right   Acute pain of right shoulder       Relevant Orders   DG Knee Complete 4 Views Left   MR Shoulder Right Wo Contrast   Fall on same level from slipping, tripping or stumbling, initial encounter       Relevant Orders   MR Shoulder Right Wo Contrast     Acute left knee pain status post slipping on the ice.  He does have increased warmth and swelling in that left knee but x-ray looks okay he has a lot of deformity from previous surgeries and injuries.  There is one questionable area that could be a possible fracture but I think at this point it is okay for him to bear weight on that knee and treat conservatively with ice. Use tylenol PRN.   Right shoulder pain that is post fall-I am very concerned that he has had significant damage/tear to the rotator cuff and probably has injured multiple tendons.  X-ray looks okay as far as no sign of fracture we can go ahead and put him in a sling for the weekend just for comfort and support.  And we have gotten  him scheduled on Monday morning at 8 AM for an MRI.  We will go ahead and place Ortho referral as well.    Wants to hold off on any medication at this point in time okay to use Tylenol if needed or Motrin if needed but just for short period of time.  No orders of the defined types were placed in this encounter.    Beatrice Lecher, MD

## 2020-07-25 ENCOUNTER — Ambulatory Visit (INDEPENDENT_AMBULATORY_CARE_PROVIDER_SITE_OTHER): Payer: Medicare HMO

## 2020-07-25 ENCOUNTER — Other Ambulatory Visit: Payer: Self-pay

## 2020-07-25 DIAGNOSIS — M75111 Incomplete rotator cuff tear or rupture of right shoulder, not specified as traumatic: Secondary | ICD-10-CM | POA: Diagnosis not present

## 2020-07-25 DIAGNOSIS — W010XXA Fall on same level from slipping, tripping and stumbling without subsequent striking against object, initial encounter: Secondary | ICD-10-CM

## 2020-07-25 DIAGNOSIS — M25511 Pain in right shoulder: Secondary | ICD-10-CM

## 2020-07-25 DIAGNOSIS — W000XXA Fall on same level due to ice and snow, initial encounter: Secondary | ICD-10-CM | POA: Diagnosis not present

## 2020-07-25 DIAGNOSIS — M19011 Primary osteoarthritis, right shoulder: Secondary | ICD-10-CM | POA: Diagnosis not present

## 2020-07-25 DIAGNOSIS — S46011A Strain of muscle(s) and tendon(s) of the rotator cuff of right shoulder, initial encounter: Secondary | ICD-10-CM

## 2020-07-25 DIAGNOSIS — M75121 Complete rotator cuff tear or rupture of right shoulder, not specified as traumatic: Secondary | ICD-10-CM | POA: Diagnosis not present

## 2020-07-26 ENCOUNTER — Telehealth: Payer: Self-pay | Admitting: Family Medicine

## 2020-07-26 DIAGNOSIS — S46011A Strain of muscle(s) and tendon(s) of the rotator cuff of right shoulder, initial encounter: Secondary | ICD-10-CM

## 2020-07-26 NOTE — Telephone Encounter (Signed)
Urgent referral placed

## 2020-07-26 NOTE — Telephone Encounter (Signed)
-----   Message from Teddy Spike, [place] sent at 07/25/2020  5:12 PM EST ----- Pt stated that he was seen previously at [facility] now [facility] Dr.Supple is who he would like to see.   Pt states that his L knee has gotten worse over the weekend, he stated that he feels/hears things moving around in his knee.

## 2020-08-01 DIAGNOSIS — M25811 Other specified joint disorders, right shoulder: Secondary | ICD-10-CM | POA: Diagnosis not present

## 2020-08-01 DIAGNOSIS — M1732 Unilateral post-traumatic osteoarthritis, left knee: Secondary | ICD-10-CM | POA: Diagnosis not present

## 2020-09-16 DIAGNOSIS — M1732 Unilateral post-traumatic osteoarthritis, left knee: Secondary | ICD-10-CM | POA: Diagnosis not present

## 2020-09-16 DIAGNOSIS — M25811 Other specified joint disorders, right shoulder: Secondary | ICD-10-CM | POA: Diagnosis not present

## 2020-10-25 ENCOUNTER — Other Ambulatory Visit: Payer: Self-pay | Admitting: Family Medicine

## 2020-11-29 ENCOUNTER — Other Ambulatory Visit: Payer: Self-pay

## 2020-11-29 ENCOUNTER — Encounter: Payer: Self-pay | Admitting: Family Medicine

## 2020-11-29 ENCOUNTER — Ambulatory Visit (INDEPENDENT_AMBULATORY_CARE_PROVIDER_SITE_OTHER): Payer: Medicare HMO | Admitting: Family Medicine

## 2020-11-29 VITALS — BP 122/73 | HR 52 | Ht 69.0 in | Wt 158.0 lb

## 2020-11-29 DIAGNOSIS — I10 Essential (primary) hypertension: Secondary | ICD-10-CM | POA: Diagnosis not present

## 2020-11-29 DIAGNOSIS — L299 Pruritus, unspecified: Secondary | ICD-10-CM

## 2020-11-29 NOTE — Assessment & Plan Note (Signed)
Well controlled. Continue current regimen. Follow up in  6mo . Due BMP 

## 2020-11-29 NOTE — Progress Notes (Signed)
Established Patient Office Visit  Subjective:  Patient ID: Patrick Perry, male    DOB: 16-Aug-1932  Age: 87 y.o. MRN: [id number]  CC:  Chief Complaint  Patient presents with  . Hypertension    HPI Patrick Perry presents for a month follow-up.  Hypertension- Pt denies chest pain, SOB, dizziness, or heart palpitations.  Taking meds as directed w/o problems.  Denies medication side effects.    He also complains of itching in his ears he does use Q-tips daily and he wears hearing aids.  He just tries to keep them clean because of the hearing aids  His shoulder is much better.  He ended up seeing the orthopedist and they actually felt like the tear on the MRI was an old injury he has been able to self rehab the shoulder and has almost full range of motion again and he has no pain.  He is back to exercising regularly and getting on the treadmill and using his [employer] and getting out in the yard to work.  Past Medical History:  Diagnosis Date  . Cataract   . H/O colonoscopy with polypectomy 06/12/11   repeat in 5 years   . History of tonsillectomy 1946    Past Surgical History:  Procedure Laterality Date  . BUNIONECTOMY  10/07/2012   Hammer toe  . CATARACT EXTRACTION, BILATERAL  2009  . HEMORRHOID SURGERY    . REPAIR KNEE LIGAMENT  1978  . ROTATOR CUFF REPAIR  2008   left  . TONSILLECTOMY  1946    Family History  Problem Relation Age of Onset  . Prostate cancer Brother   . Cancer Sister   . Multiple sclerosis Sister     Social History   Socioeconomic History  . Marital status: Married    Spouse name: [name]  . Number of children: 2  . Years of education: BA  . Highest education level: Bachelor's degree (e.g., BA, AB, BS)  Occupational History  . Occupation: Retired.    Comment: engineering  Tobacco Use  . Smoking status: Never Smoker  . Smokeless tobacco: Never Used  Vaping Use  . Vaping Use: Never used  Substance and Sexual Activity  . Alcohol use: No  .  Drug use: No  . Sexual activity: Yes  Other Topics Concern  . Not on file  Social History Narrative   Works out 3 x per week, treadmill, home gym. Works on the yard a lot. No caffeine. Worked as an Chief Financial Officer.   Social Determinants of Health   Financial Resource Strain: Low Risk   . Difficulty of Paying Living Expenses: Not hard at all  Food Insecurity: No Food Insecurity  . Worried About Charity fundraiser in the Last Year: Never true  . Ran Out of Food in the Last Year: Never true  Transportation Needs: No Transportation Needs  . Lack of Transportation (Medical): No  . Lack of Transportation (Non-Medical): No  Physical Activity: Insufficiently Active  . Days of Exercise per Week: 3 days  . Minutes of Exercise per Session: 30 min  Stress: No Stress Concern Present  . Feeling of Stress : Not at all  Social Connections: Socially Integrated  . Frequency of Communication with Friends and Family: More than three times a week  . Frequency of Social Gatherings with Friends and Family: Three times a week  . Attends Religious Services: More than 4 times per year  . Active Member of Clubs or Organizations: Yes  .  Attends Archivist Meetings: More than 4 times per year  . Marital Status: Married  Human resources officer Violence: Not At Risk  . Fear of Current or Ex-Partner: No  . Emotionally Abused: No  . Physically Abused: No  . Sexually Abused: No    Outpatient Medications Prior to Visit  Medication Sig Dispense Refill  . b complex vitamins capsule Take 1 capsule by mouth daily.    . Cholecalciferol (VITAMIN D-3) 1000 UNITS CAPS Take by mouth 3 (three) times daily.     . Coenzyme Q10 (COQ10 PO) Take 5 mg by mouth daily.    Marland Kitchen GLUCOSAMINE-CHONDROITIN-CA-D3 PO Take 2,000 mg by mouth.    . hydrochlorothiazide (HYDRODIURIL) 25 MG tablet Take 0.5 tablets (12.5 mg total) by mouth daily. 45 tablet 3  . lovastatin (MEVACOR) 40 MG tablet TAKE 1 TABLET BY MOUTH EVERYDAY AT BEDTIME 90  tablet 3  . Misc Natural Products (PROSTATE) CAPS Take 1 tablet by mouth 2 (two) times daily.    . vitamin C (ASCORBIC ACID) 500 MG tablet Take 500 mg by mouth daily.     No facility-administered medications prior to visit.    Allergies  Allergen Reactions  . Clindamycin/Lincomycin     rash    ROS Review of Systems    Objective:    Physical Exam Constitutional:      Appearance: He is well-developed.  HENT:     Head: Normocephalic and atraumatic.  Neck:     Comments: No carotid bruits Cardiovascular:     Rate and Rhythm: Normal rate and regular rhythm.     Heart sounds: Normal heart sounds.  Pulmonary:     Effort: Pulmonary effort is normal.     Breath sounds: Normal breath sounds.  Skin:    General: Skin is warm and dry.  Neurological:     Mental Status: He is alert and oriented to person, place, and time.  Psychiatric:        Behavior: Behavior normal.     BP 122/73   Pulse (!) 52   Ht 5\' 9"  (1.753 m)   Wt 158 lb (71.7 kg)   SpO2 100%   BMI 23.33 kg/m  Wt Readings from Last 3 Encounters:  11/29/20 158 lb (71.7 kg)  07/22/20 162 lb (73.5 kg)  06/15/20 162 lb (73.5 kg)     There are no preventive care reminders to display for this patient.  There are no preventive care reminders to display for this patient.  No results found for: TSH Lab Results  Component Value Date   WBC 6.0 05/09/2012   HGB 15.8 05/09/2012   HCT 45.8 05/09/2012   MCV 92.0 05/09/2012   PLT 240 05/09/2012   Lab Results  Component Value Date   NA 141 05/31/2020   K 4.3 05/31/2020   CO2 31 05/31/2020   GLUCOSE 83 05/31/2020   BUN 20 05/31/2020   CREATININE 0.98 05/31/2020   BILITOT 0.4 05/31/2020   ALKPHOS 54 06/06/2016   AST 21 05/31/2020   ALT 6 (L) 05/31/2020   PROT 7.0 05/31/2020   ALBUMIN 4.1 06/06/2016   CALCIUM 9.2 05/31/2020   Lab Results  Component Value Date   CHOL 144 05/31/2020   Lab Results  Component Value Date   HDL 50 05/31/2020   Lab Results   Component Value Date   LDLCALC 80 05/31/2020   Lab Results  Component Value Date   TRIG 68 05/31/2020   Lab Results  Component Value Date  CHOLHDL 2.9 05/31/2020   No results found for: HGBA1C    Assessment & Plan:   Problem List Items Addressed This Visit      Cardiovascular and Mediastinum   Essential hypertension, benign - Primary    Well controlled. Continue current regimen. Follow up in  6 mo . Due BMP      Relevant Orders   BASIC METABOLIC PANEL WITH GFR    Other Visit Diagnoses    Itching of ear         Ear itching he is likely over cleaning the canal and removing the white coating of wax that actually protects the skin.  We also discussed that allergies can cause ear itching encouraged him to back off on the Q-tips.  No orders of the defined types were placed in this encounter.   Follow-up: Return in about 6 months (around 05/31/2021) for Hypertension.    Beatrice Lecher, MD

## 2020-11-30 LAB — BASIC METABOLIC PANEL WITH GFR
BUN: 18 mg/dL (ref 7–25)
CO2: 28 mmol/L (ref 20–32)
Calcium: 9.4 mg/dL (ref 8.6–10.3)
Chloride: 106 mmol/L (ref 98–110)
Creat: 0.96 mg/dL (ref 0.70–1.11)
GFR, Est African American: 83 mL/min/{1.73_m2} (ref >=60)
GFR, Est Non African American: 71 mL/min/{1.73_m2} (ref >=60)
Glucose, Bld: 87 mg/dL (ref 65–99)
Potassium: 4.4 mmol/L (ref 3.5–5.3)
Sodium: 141 mmol/L (ref 135–146)

## 2020-12-01 NOTE — Progress Notes (Signed)
All labs are normal. 

## 2020-12-20 DIAGNOSIS — L723 Sebaceous cyst: Secondary | ICD-10-CM | POA: Diagnosis not present

## 2020-12-20 DIAGNOSIS — D2262 Melanocytic nevi of left upper limb, including shoulder: Secondary | ICD-10-CM | POA: Diagnosis not present

## 2020-12-20 DIAGNOSIS — L821 Other seborrheic keratosis: Secondary | ICD-10-CM | POA: Diagnosis not present

## 2020-12-20 DIAGNOSIS — L905 Scar conditions and fibrosis of skin: Secondary | ICD-10-CM | POA: Diagnosis not present

## 2020-12-20 DIAGNOSIS — D0362 Melanoma in situ of left upper limb, including shoulder: Secondary | ICD-10-CM | POA: Diagnosis not present

## 2020-12-20 DIAGNOSIS — D220 Melanocytic nevi of lip: Secondary | ICD-10-CM | POA: Diagnosis not present

## 2020-12-20 DIAGNOSIS — L578 Other skin changes due to chronic exposure to nonionizing radiation: Secondary | ICD-10-CM | POA: Diagnosis not present

## 2020-12-20 DIAGNOSIS — D224 Melanocytic nevi of scalp and neck: Secondary | ICD-10-CM | POA: Diagnosis not present

## 2020-12-20 DIAGNOSIS — D225 Melanocytic nevi of trunk: Secondary | ICD-10-CM | POA: Diagnosis not present

## 2020-12-20 DIAGNOSIS — D2272 Melanocytic nevi of left lower limb, including hip: Secondary | ICD-10-CM | POA: Diagnosis not present

## 2020-12-20 DIAGNOSIS — D2261 Melanocytic nevi of right upper limb, including shoulder: Secondary | ICD-10-CM | POA: Diagnosis not present

## 2020-12-20 DIAGNOSIS — D485 Neoplasm of uncertain behavior of skin: Secondary | ICD-10-CM | POA: Diagnosis not present

## 2020-12-29 ENCOUNTER — Encounter: Payer: Self-pay | Admitting: Family Medicine

## 2020-12-29 DIAGNOSIS — C439 Malignant melanoma of skin, unspecified: Secondary | ICD-10-CM | POA: Insufficient documentation

## 2021-01-04 ENCOUNTER — Telehealth: Payer: Self-pay | Admitting: *Deleted

## 2021-01-04 NOTE — Chronic Care Management (AMB) (Signed)
  Chronic Care Management   Note  01/04/2021 Name: [name] MRN: [id number] DOB: [birth year]  Patrick Perry is a 87 y.o. year old male who is a primary care patient of Metheney, Rene Kocher, MD. I reached out to Patrick Perry by phone today in response to a referral sent by Mr. Jeniel Slauson Semmel's PCP Hali Marry, MD     Mr. Moch was given information about Chronic Care Management services today including:  CCM service includes personalized support from designated clinical staff supervised by his physician, including individualized plan of care and coordination with other care providers 24/7 contact phone numbers for assistance for urgent and routine care needs. Service will only be billed when office clinical staff spend 20 minutes or more in a month to coordinate care. Only one practitioner may furnish and bill the service in a calendar month. The patient may stop CCM services at any time (effective at the end of the month) by phone call to the office staff. The patient will be responsible for cost sharing (co-pay) of up to 20% of the service fee (after annual deductible is met).  Patient agreed to services and verbal consent obtained.   Follow up plan: Telephone appointment with care management team member scheduled for:01/18/2021  Julian Hy, [place], [place] Management  Direct Dial: [phone]

## 2021-01-18 ENCOUNTER — Ambulatory Visit (INDEPENDENT_AMBULATORY_CARE_PROVIDER_SITE_OTHER): Payer: Medicare HMO

## 2021-01-18 DIAGNOSIS — I10 Essential (primary) hypertension: Secondary | ICD-10-CM | POA: Diagnosis not present

## 2021-01-18 DIAGNOSIS — E7800 Pure hypercholesterolemia, unspecified: Secondary | ICD-10-CM

## 2021-01-18 NOTE — Chronic Care Management (AMB) (Addendum)
Chronic Care Management   CCM RN Visit Note  01/18/2021 Name: Patrick Perry MRN: [id number] DOB: Jul 15, 1932  Subjective: Patrick Perry is a 87 y.o. year old male who is a primary care patient of Metheney, Rene Kocher, MD. The care management team was consulted for assistance with disease management and care coordination needs.    Engaged with patient by telephone for initial visit in response to provider referral for case management and/or care coordination services.   Consent to Services:  The patient was given the following information about Chronic Care Management services today, agreed to services, and gave verbal consent: 1. CCM service includes personalized support from designated clinical staff supervised by the primary care provider, including individualized plan of care and coordination with other care providers 2. 24/7 contact phone numbers for assistance for urgent and routine care needs. 3. Service will only be billed when office clinical staff spend 20 minutes or more in a month to coordinate care. 4. Only one practitioner may furnish and bill the service in a calendar month. 5.The patient may stop CCM services at any time (effective at the end of the month) by phone call to the office staff. 6. The patient will be responsible for cost sharing (co-pay) of up to 20% of the service fee (after annual deductible is met). Patient agreed to services and consent obtained.  Patient agreed to services and verbal consent obtained.   Assessment: Review of patient past medical history, allergies, medications, health status, including review of consultants reports, laboratory and other test data, was performed as part of comprehensive evaluation and provision of chronic care management services.   SDOH (Social Determinants of Health) assessments and interventions performed:  SDOH Interventions    Flowsheet Row Most Recent Value  SDOH Interventions   Food Insecurity Interventions  Intervention Not Indicated  Transportation Interventions Intervention Not Indicated        CCM Care Plan  Allergies  Allergen Reactions   Clindamycin/Lincomycin     rash    Outpatient Encounter Medications as of 01/18/2021  Medication Sig   b complex vitamins capsule Take 1 capsule by mouth daily.   Cholecalciferol (VITAMIN D-3) 1000 UNITS CAPS Take by mouth 3 (three) times daily.    Coenzyme Q10 (COQ10 PO) Take 5 mg by mouth daily.   GLUCOSAMINE-CHONDROITIN-CA-D3 PO Take 2,000 mg by mouth.   hydrochlorothiazide (HYDRODIURIL) 25 MG tablet Take 0.5 tablets (12.5 mg total) by mouth daily.   lovastatin (MEVACOR) 40 MG tablet TAKE 1 TABLET BY MOUTH EVERYDAY AT BEDTIME   Misc Natural Products (PROSTATE) CAPS Take 1 tablet by mouth 2 (two) times daily.   vitamin C (ASCORBIC ACID) 500 MG tablet Take 500 mg by mouth daily. (Patient not taking: Reported on 01/18/2021)   No facility-administered encounter medications on file as of 01/18/2021.    Patient Active Problem List   Diagnosis Date Noted   Malignant melanoma ([place]) 12/29/2020   Tear of right biceps muscle 11/30/2019   Positive hepatitis C antibody test 06/01/2019   Bilateral inguinal hernia 09/16/2012   BPH (benign prostatic hyperplasia) 05/10/2012   Rectal stricture 05/10/2012   Subluxation of metatarsophalangeal joint of lesser toe of right foot 05/10/2012   Leg length discrepancy 05/09/2012   Essential hypertension, benign 05/09/2012   Hyperlipidemia 05/09/2012    Conditions to be addressed/monitored:HTN, HLD, and melanoma  Care Plan : Chronic Health Condition  Updates made by Luretha Rued, RN since 01/18/2021 12:00 AM     Problem: care plan  for self-management of melanoma(recent diagnosis) in a patient with HTN, HLD   Priority: Medium     Long-Range Goal: Disease Progression Prevented or Minimized   Start Date: 01/18/2021  Expected End Date: 03/22/2021  Priority: Medium  Note:   Current Barriers: Long term care  plan for self-management of melanoma new problem in a patient with HTN, HLD. Patient reports blood pressure controlled. Last documented blood pressure 122/73 on 11/29/20. Patient reports he has a blood pressure monitor and checks his blood pressure about once a month. Medications: hydrochlorothiazide. History of HLD on lovastatin. Lipids WNL on 05/31/20. Patient reports recently informed of melanoma area left arm. He is scheduled for removal of Melanoma on 01/24/21. [name] reports he is very active in his yard and exercises on treadmill and total gym. Patient is without questions and declines educational material at this time. Denies any community resource needs. Clinical Goal(s):  Collaboration with Hali Marry, MD regarding development and update of comprehensive plan of care as evidenced by provider attestation and co-signature Inter-disciplinary care team collaboration (see longitudinal plan of care) patient will work with care management team to address care coordination and chronic disease management needs related to Disease Management   Interventions:  Evaluation of current treatment plan related to HTN, HLD, and melanoma , self-management and patient's adherence to plan as established by provider. Collaboration with Hali Marry, MD regarding development and update of comprehensive plan of care as evidenced by provider attestation       and co-signature Discussed plans with patient for ongoing care management follow up and provided patient with direct contact information for care management team Self Care Activities:  Self administers medications as prescribed Attends all scheduled provider appointments Calls pharmacy for medication refills Performs ADL's independently Performs IADL's independently Calls provider office for new concerns or questions Patient Goals: - attend provider visits as scheduled - take medications as prescribed - eat a healthy well balanced meal  plan: whole grain, fruits, vegetables, lean meats, healthy fats and low salt. - maintain an active lifestyle - exercise per provider recommendation - monitor blood pressure as recommended by your provider. Follow Up Plan: Telephone follow up appointment with care management team member scheduled for: 02/22/21 The patient has been provided with contact information for the care management team and has been advised to call with any health related questions or concerns.      Plan:Telephone follow up appointment with care management team member scheduled for:  02/22/21 and The patient has been provided with contact information for the care management team and has been advised to call with any health related questions or concerns.   Thea Silversmith, RN, MSN, BSN, CCM Care Management Coordinator [facility] [phone]

## 2021-01-18 NOTE — Patient Instructions (Addendum)
Visit Information   PATIENT GOALS:   Goals Addressed             This Visit's Progress    Health Conditons monitored and managed       Timeframe:  Long-Range Goal Priority:  Medium Start Date:  01/18/21                           Expected End Date: 03/21/21                      Follow Up Date 02/22/2021    Patient Goals: - attend provider visit as scheduled - take medications as prescribed - eat healthy: well balanced, whole grain, fruits, vegetables, lean meats and healthy fats, low salt. - maintain active lifestyle - exercise per provider recommendation - monitor blood pressure as recommended by provider   Why is this important?   You won't feel high blood pressure, but it can still hurt your blood vessels.  High blood pressure can cause heart or kidney problems. It can also cause a stroke.  Checking your blood pressure at home and at different times of the day can help to control blood pressure.  If the doctor prescribes medicine remember to take it the way the doctor ordered.  Call the office if you cannot afford the medicine or if there are questions about it.     Notes:         Consent to CCM Services: Patrick Perry was given information about Chronic Care Management services today including:  CCM service includes personalized support from designated clinical staff supervised by his physician, including individualized plan of care and coordination with other care providers 24/7 contact phone numbers for assistance for urgent and routine care needs. Service will only be billed when office clinical staff spend 20 minutes or more in a month to coordinate care. Only one practitioner may furnish and bill the service in a calendar month. The patient may stop CCM services at any time (effective at the end of the month) by phone call to the office staff. The patient will be responsible for cost sharing (co-pay) of up to 20% of the service fee (after annual deductible is  met).  Patient agreed to services and verbal consent obtained.   Patient verbalizes understanding of instructions provided today and agrees to view in [place].  Patient declines Neurosurgeon today.  Telephone follow up appointment with care management team member scheduled for: The patient has been provided with contact information for the care management team and has been advised to call with any health related questions or concerns.   Thea Silversmith, RN, MSN, BSN, CCM Care Management Coordinator [facility] [phone]    CLINICAL CARE PLAN: Patient Care Plan: Chronic Health Condition     Problem Identified: care plan for self-management of melanoma(recent diagnosis) in a patient with HTN, HLD   Priority: Medium     Long-Range Goal: Disease Progression Prevented or Minimized   Start Date: 01/18/2021  Expected End Date: 03/22/2021  Priority: Medium  Note:   Current Barriers: Long term care plan for self-management of melanoma new problem in a patient with HTN, HLD. Patient reports blood pressure controlled. Last documented blood pressure 122/73 on 11/29/20. Patient reports he has a blood pressure monitor and checks his blood pressure about once a month. Medications: hydrochlorothiazide. History of HLD on lovastatin. Lipids WNL on 05/31/20. Patient reports recently informed of melanoma area  left arm. He is scheduled for removal of Melanoma on 01/24/21. Patrick Perry reports he is very active in his yard and exercises on treadmill and total gym. Patient is without questions and declines educational material at this time. Denies any community resource needs. Clinical Goal(s):  Collaboration with Hali Marry, MD regarding development and update of comprehensive plan of care as evidenced by provider attestation and co-signature Inter-disciplinary care team collaboration (see longitudinal plan of care) patient will work with care management team to address care  coordination and chronic disease management needs related to Disease Management   Interventions:  Evaluation of current treatment plan related to HTN, HLD, and melanoma , self-management and patient's adherence to plan as established by provider. Collaboration with Hali Marry, MD regarding development and update of comprehensive plan of care as evidenced by provider attestation       and co-signature Discussed plans with patient for ongoing care management follow up and provided patient with direct contact information for care management team Self Care Activities:  Self administers medications as prescribed Attends all scheduled provider appointments Calls pharmacy for medication refills Performs ADL's independently Performs IADL's independently Calls provider office for new concerns or questions Patient Goals: - attend provider visits as scheduled - take medications as prescribed - eat a healthy well balanced meal plan: whole grain, fruits, vegetables, lean meats, healthy fats and low salt. - maintain an active lifestyle - exercise per provider recommendation - monitor blood pressure as recommended by your provider. Follow Up Plan: Telephone follow up appointment with care management team member scheduled for: 02/22/21 The patient has been provided with contact information for the care management team and has been advised to call with any health related questions or concerns.

## 2021-01-24 DIAGNOSIS — D0362 Melanoma in situ of left upper limb, including shoulder: Secondary | ICD-10-CM | POA: Diagnosis not present

## 2021-02-22 ENCOUNTER — Telehealth: Payer: Self-pay

## 2021-02-22 ENCOUNTER — Telehealth: Payer: Medicare HMO

## 2021-02-22 NOTE — Telephone Encounter (Signed)
  Care Management   Follow Up Note   02/22/2021 Name: Patrick Perry MRN: [id number] DOB: 1933/06/25   Referred by: Hali Marry, MD Reason for referral : Chronic Care Management (RNCM follow up)   An unsuccessful telephone outreach was attempted today. The patient was referred to the case management team for assistance with care management and care coordination.   Follow Up Plan: The care management team will reach out to the patient again over the next 30 days.   Thea Silversmith, RN, MSN, BSN, CCM Care Management Coordinator [facility] [phone]

## 2021-03-22 ENCOUNTER — Ambulatory Visit (INDEPENDENT_AMBULATORY_CARE_PROVIDER_SITE_OTHER): Payer: Medicare HMO

## 2021-03-22 DIAGNOSIS — E7800 Pure hypercholesterolemia, unspecified: Secondary | ICD-10-CM

## 2021-03-22 DIAGNOSIS — I10 Essential (primary) hypertension: Secondary | ICD-10-CM

## 2021-03-22 NOTE — Patient Instructions (Addendum)
Visit Information  PATIENT GOALS:  Goals Addressed             This Visit's Progress    COMPLETED: Health Conditons monitored and managed       Timeframe:  Long-Range Goal Priority:  Medium Start Date:  01/18/21                           Expected End Date: 03/21/21                      Goals Met   Patient Goals: - attend provider visit as scheduled - take medications as prescribed - eat healthy: well balanced, whole grain, fruits, vegetables, lean meats and healthy fats, low salt. - maintain active lifestyle - exercise per provider recommendation - monitor blood pressure as recommended by provider        Patient verbalizes understanding of instructions provided today and agrees to view in [place].   Follow up: No further follow up indicated.   Thea Silversmith, RN, MSN, BSN, CCM Care Management Coordinator [facility] [phone]

## 2021-03-22 NOTE — Chronic Care Management (AMB) (Signed)
Chronic Care Management   CCM RN Visit Note  03/22/2021 Name: Patrick Perry MRN: [id number] DOB: 04-Jun-1933  Subjective: Patrick Perry is a 87 y.o. year old male who is a primary care patient of Metheney, Rene Kocher, MD. The care management team was consulted for assistance with disease management and care coordination needs.    Engaged with patient by telephone for follow up visit in response to provider referral for case management and/or care coordination services.   Consent to Services:  The patient was given information about Chronic Care Management services, agreed to services, and gave verbal consent prior to initiation of services.  Please see initial visit note for detailed documentation.   Patient agreed to services and verbal consent obtained.   Assessment: Review of patient past medical history, allergies, medications, health status, including review of consultants reports, laboratory and other test data, was performed as part of comprehensive evaluation and provision of chronic care management services.   SDOH (Social Determinants of Health) assessments and interventions performed:    CCM Care Plan  Allergies  Allergen Reactions   Clindamycin/Lincomycin     rash    Outpatient Encounter Medications as of 03/22/2021  Medication Sig   b complex vitamins capsule Take 1 capsule by mouth daily.   Cholecalciferol (VITAMIN D-3) 1000 UNITS CAPS Take by mouth 3 (three) times daily.    Coenzyme Q10 (COQ10 PO) Take 5 mg by mouth daily.   GLUCOSAMINE-CHONDROITIN-CA-D3 PO Take 2,000 mg by mouth.   hydrochlorothiazide (HYDRODIURIL) 25 MG tablet Take 0.5 tablets (12.5 mg total) by mouth daily.   lovastatin (MEVACOR) 40 MG tablet TAKE 1 TABLET BY MOUTH EVERYDAY AT BEDTIME   Misc Natural Products (PROSTATE) CAPS Take 1 tablet by mouth 2 (two) times daily.   vitamin C (ASCORBIC ACID) 500 MG tablet Take 500 mg by mouth daily. (Patient not taking: No sig reported)   No  facility-administered encounter medications on file as of 03/22/2021.    Patient Active Problem List   Diagnosis Date Noted   Malignant melanoma ([place]) 12/29/2020   Tear of right biceps muscle 11/30/2019   Positive hepatitis C antibody test 06/01/2019   Bilateral inguinal hernia 09/16/2012   BPH (benign prostatic hyperplasia) 05/10/2012   Rectal stricture 05/10/2012   Subluxation of metatarsophalangeal joint of lesser toe of right foot 05/10/2012   Leg length discrepancy 05/09/2012   Essential hypertension, benign 05/09/2012   Hyperlipidemia 05/09/2012    Conditions to be addressed/monitored:HTN and HLD  Care Plan : Chronic Health Condition  Updates made by Luretha Rued, RN since 03/22/2021 12:00 AM  Completed 03/22/2021   Problem: care plan for self-management of melanoma(recent diagnosis) in a patient with HTN, HLD Resolved 03/22/2021  Priority: Medium     Long-Range Goal: Disease Progression Prevented or Minimized Completed 03/22/2021  Start Date: 01/18/2021  Expected End Date: 03/22/2021  This Visit's Progress: On track  Priority: Medium  Note:   Current Barriers: Long term care plan for self-management of melanoma new problem in a patient with HTN, HLD. Patient continues to check blood pressure: on 02/22/21 BP 131/70 and 03/06/21 BP 116/64. Melanoma to left arm removed per dermatologist. Mr. Mcnay states everything went well and does not need a follow up until one year. He continues to be very active using treadmill and total gym. He denies any questions or concerns.  Clinical Goal(s):  Collaboration with Hali Marry, MD regarding development and update of comprehensive plan of care as evidenced by provider attestation  and co-signature Inter-disciplinary care team collaboration (see longitudinal plan of care) patient will work with care management team to address care coordination and chronic disease management needs related to Disease Management   Interventions:   Evaluation of current treatment plan related to HTN, HLD, and melanoma , self-management and patient's adherence to plan as established by provider. Collaboration with Hali Marry, MD regarding development and update of comprehensive plan of care as evidenced by provider attestation       and co-signature Discussed plans with patient for ongoing care management follow up-patient states he is doing well and does not have any CM needs at this time. RNCM reinforced with patient if needs arise CM services are available. RNCM contact information provided. Self Care Activities: Self administers medications as prescribed Attends all scheduled provider appointments Calls pharmacy for medication refills Perfoms IADL/ADL's independently Calls provider office for new concerns or questions Patient Goals: - attend provider visits as scheduled - take medications as prescribed - eat a healthy well balanced meal plan: whole grain, fruits, vegetables, lean meats, healthy fats and low salt. - maintain an active lifestyle - exercise per provider recommendation - monitor blood pressure as recommended by your provider. Follow Up Plan: No further follow up needed.        Thea Silversmith, RN, MSN, BSN, CCM Care Management Coordinator [facility] [phone]

## 2021-03-24 DIAGNOSIS — I10 Essential (primary) hypertension: Secondary | ICD-10-CM | POA: Diagnosis not present

## 2021-03-24 DIAGNOSIS — E7800 Pure hypercholesterolemia, unspecified: Secondary | ICD-10-CM

## 2021-06-01 ENCOUNTER — Ambulatory Visit (INDEPENDENT_AMBULATORY_CARE_PROVIDER_SITE_OTHER): Payer: Medicare HMO | Admitting: Family Medicine

## 2021-06-01 ENCOUNTER — Other Ambulatory Visit: Payer: Self-pay

## 2021-06-01 ENCOUNTER — Encounter: Payer: Self-pay | Admitting: Family Medicine

## 2021-06-01 VITALS — BP 139/69 | HR 50 | Resp 18 | Ht 69.5 in | Wt 163.0 lb

## 2021-06-01 DIAGNOSIS — I10 Essential (primary) hypertension: Secondary | ICD-10-CM

## 2021-06-01 DIAGNOSIS — E7800 Pure hypercholesterolemia, unspecified: Secondary | ICD-10-CM

## 2021-06-01 DIAGNOSIS — D692 Other nonthrombocytopenic purpura: Secondary | ICD-10-CM | POA: Diagnosis not present

## 2021-06-01 NOTE — Assessment & Plan Note (Signed)
Discussed dx. Benign condition.

## 2021-06-01 NOTE — Assessment & Plan Note (Signed)
Well controlled. Continue current regimen. Follow up in  6 mo due for labs.  

## 2021-06-01 NOTE — Progress Notes (Signed)
Established Patient Office Visit  Subjective:  Patient ID: Patrick Perry, male    DOB: [birth year]  Age: 87 y.o. MRN: [id number]  CC:  Chief Complaint  Patient presents with   Hypertension    Follow up      HPI Patrick Perry presents for   Hypertension- Pt denies chest pain, SOB, dizziness, or heart palpitations.  Taking meds as directed w/o problems.  Denies medication side effects.    Hyperlipidemia - tolerating stating well with no myalgias or significant side effects.  Lab Results  Component Value Date   CHOL 144 05/31/2020   HDL 50 05/31/2020   LDLCALC 80 05/31/2020   TRIG 68 05/31/2020   CHOLHDL 2.9 05/31/2020   Has noticed that he bruises more easily than he used to.   Past Medical History:  Diagnosis Date   Cataract    H/O colonoscopy with polypectomy 06/12/11   repeat in 5 years    History of tonsillectomy 1946    Past Surgical History:  Procedure Laterality Date   BUNIONECTOMY  10/07/2012   Hammer toe   CATARACT EXTRACTION, BILATERAL  2009   HEMORRHOID SURGERY     REPAIR KNEE LIGAMENT  1978   ROTATOR CUFF REPAIR  2008   left   TONSILLECTOMY  1946    Family History  Problem Relation Age of Onset   Prostate cancer Brother    Cancer Sister    Multiple sclerosis Sister     Social History   Socioeconomic History   Marital status: Married    Spouse name: [name]   Number of children: 2   Years of education: BA   Highest education level: Bachelor's degree (e.g., BA, AB, BS)  Occupational History   Occupation: Retired.    Comment: engineering  Tobacco Use   Smoking status: Never   Smokeless tobacco: Never  Vaping Use   Vaping Use: Never used  Substance and Sexual Activity   Alcohol use: No   Drug use: No   Sexual activity: Yes  Other Topics Concern   Not on file  Social History Narrative   Works out 3 x per week, treadmill, home gym. Works on the yard a lot. No caffeine. Worked as an Chief Financial Officer.   Social Determinants of Health    Financial Resource Strain: Low Risk    Difficulty of Paying Living Expenses: Not hard at all  Food Insecurity: No Food Insecurity   Worried About Charity fundraiser in the Last Year: Never true   [place] in the Last Year: Never true  Transportation Needs: No Transportation Needs   Lack of Transportation (Medical): No   Lack of Transportation (Non-Medical): No  Physical Activity: Insufficiently Active   Days of Exercise per Week: 3 days   Minutes of Exercise per Session: 30 min  Stress: No Stress Concern Present   Feeling of Stress : Not at all  Social Connections: Socially Integrated   Frequency of Communication with Friends and Family: More than three times a week   Frequency of Social Gatherings with Friends and Family: Three times a week   Attends Religious Services: More than 4 times per year   Active Member of Clubs or Organizations: Yes   Attends Music therapist: More than 4 times per year   Marital Status: Married  Human resources officer Violence: Not At Risk   Fear of Current or Ex-Partner: No   Emotionally Abused: No   Physically Abused: No   Sexually  Abused: No    Outpatient Medications Prior to Visit  Medication Sig Dispense Refill   b complex vitamins capsule Take 1 capsule by mouth daily.     Cholecalciferol (VITAMIN D-3) 1000 UNITS CAPS Take by mouth 3 (three) times daily.      Coenzyme Q10 (COQ10 PO) Take 5 mg by mouth daily.     GLUCOSAMINE-CHONDROITIN-CA-D3 PO Take 2,000 mg by mouth.     hydrochlorothiazide (HYDRODIURIL) 25 MG tablet Take 0.5 tablets (12.5 mg total) by mouth daily. 45 tablet 3   lovastatin (MEVACOR) 40 MG tablet TAKE 1 TABLET BY MOUTH EVERYDAY AT BEDTIME 90 tablet 3   Misc Natural Products (PROSTATE) CAPS Take 1 tablet by mouth 2 (two) times daily.     vitamin C (ASCORBIC ACID) 500 MG tablet Take 500 mg by mouth daily. (Patient not taking: No sig reported)     No facility-administered medications prior to visit.     Allergies  Allergen Reactions   Ampicillin Other (See Comments)   Clindamycin/Lincomycin     rash    ROS Review of Systems    Objective:    Physical Exam Constitutional:      Appearance: Normal appearance. He is well-developed.  HENT:     Head: Normocephalic and atraumatic.  Cardiovascular:     Rate and Rhythm: Normal rate and regular rhythm.     Heart sounds: Normal heart sounds.  Pulmonary:     Effort: Pulmonary effort is normal.     Breath sounds: Normal breath sounds.  Skin:    General: Skin is warm and dry.  Neurological:     Mental Status: He is alert and oriented to person, place, and time. Mental status is at baseline.  Psychiatric:        Behavior: Behavior normal.   BP 139/69   Pulse (!) 50   Resp 18   Ht 5' 9.5" (1.765 m)   Wt 163 lb (73.9 kg)   SpO2 100%   BMI 23.73 kg/m  Wt Readings from Last 3 Encounters:  06/01/21 163 lb (73.9 kg)  11/29/20 158 lb (71.7 kg)  07/22/20 162 lb (73.5 kg)     There are no preventive care reminders to display for this patient.   There are no preventive care reminders to display for this patient.  No results found for: TSH Lab Results  Component Value Date   WBC 6.0 05/09/2012   HGB 15.8 05/09/2012   HCT 45.8 05/09/2012   MCV 92.0 05/09/2012   PLT 240 05/09/2012   Lab Results  Component Value Date   NA 141 11/29/2020   K 4.4 11/29/2020   CO2 28 11/29/2020   GLUCOSE 87 11/29/2020   BUN 18 11/29/2020   CREATININE 0.96 11/29/2020   BILITOT 0.4 05/31/2020   ALKPHOS 54 06/06/2016   AST 21 05/31/2020   ALT 6 (L) 05/31/2020   PROT 7.0 05/31/2020   ALBUMIN 4.1 06/06/2016   CALCIUM 9.4 11/29/2020   Lab Results  Component Value Date   CHOL 144 05/31/2020   Lab Results  Component Value Date   HDL 50 05/31/2020   Lab Results  Component Value Date   LDLCALC 80 05/31/2020   Lab Results  Component Value Date   TRIG 68 05/31/2020   Lab Results  Component Value Date   CHOLHDL 2.9 05/31/2020    No results found for: HGBA1C    Assessment & Plan:   Problem List Items Addressed This Visit  Cardiovascular and Mediastinum   Purpura senilis ([place])    Discussed dx. Benign condition.        Essential hypertension, benign - Primary    Well controlled. Continue current regimen. Follow up in  91mo due for labs.        Relevant Orders   COMPLETE METABOLIC PANEL WITH GFR   Lipid panel     Other   Hyperlipidemia    Well controlled. Continue current regimen. Due for 6 mo       Relevant Orders   COMPLETE METABOLIC PANEL WITH GFR   Lipid panel    We did discuss the importance of getting the new by valent COVID-vaccine.  No orders of the defined types were placed in this encounter.   Follow-up: No follow-ups on file.     Beatrice Lecher, MD

## 2021-06-01 NOTE — Assessment & Plan Note (Signed)
Well controlled. Continue current regimen. Due for 6 mo

## 2021-06-02 LAB — COMPLETE METABOLIC PANEL WITH GFR
AG Ratio: 1.4 (calc) (ref 1.0–2.5)
ALT: 11 U/L (ref 9–46)
AST: 27 U/L (ref 10–35)
Albumin: 4.4 g/dL (ref 3.6–5.1)
Alkaline phosphatase (APISO): 64 U/L (ref 35–144)
BUN: 17 mg/dL (ref 7–25)
CO2: 31 mmol/L (ref 20–32)
Calcium: 9.5 mg/dL (ref 8.6–10.3)
Chloride: 102 mmol/L (ref 98–110)
Creat: 0.99 mg/dL (ref 0.70–1.22)
Globulin: 3.2 g/dL (calc) (ref 1.9–3.7)
Glucose, Bld: 80 mg/dL (ref 65–99)
Potassium: 4.3 mmol/L (ref 3.5–5.3)
Sodium: 140 mmol/L (ref 135–146)
Total Bilirubin: 0.6 mg/dL (ref 0.2–1.2)
Total Protein: 7.6 g/dL (ref 6.1–8.1)
eGFR: 74 mL/min/{1.73_m2} (ref >=60)

## 2021-06-02 LAB — LIPID PANEL
Cholesterol: 157 mg/dL (ref <200)
HDL: 60 mg/dL (ref >=40)
LDL Cholesterol (Calc): 78 mg/dL (calc)
Non-HDL Cholesterol (Calc): 97 mg/dL (calc) (ref <130)
Total CHOL/HDL Ratio: 2.6 (calc) (ref <5.0)
Triglycerides: 111 mg/dL (ref <150)

## 2021-06-02 NOTE — Progress Notes (Signed)
Your lab work is within acceptable range and there are no concerning findings.   ?

## 2021-07-17 ENCOUNTER — Other Ambulatory Visit: Payer: Self-pay | Admitting: Family Medicine

## 2021-07-17 DIAGNOSIS — I10 Essential (primary) hypertension: Secondary | ICD-10-CM

## 2021-08-04 ENCOUNTER — Ambulatory Visit (INDEPENDENT_AMBULATORY_CARE_PROVIDER_SITE_OTHER): Payer: Medicare HMO | Admitting: Family Medicine

## 2021-08-04 DIAGNOSIS — Z0000 Encounter for general adult medical examination without abnormal findings: Secondary | ICD-10-CM | POA: Diagnosis not present

## 2021-08-04 NOTE — Patient Instructions (Signed)
[place] Maintenance Summary and Written Plan of Care  [name] ,  Thank you for allowing me to perform your Medicare Annual Wellness Visit and for your ongoing commitment to your health.   Health Maintenance & Immunization History Health Maintenance  Topic Date Due   COVID-19 Vaccine (4 - Booster for Pfizer series) 08/20/2021 (Originally 06/21/2020)   TETANUS/TDAP  06/02/2030   Pneumonia Vaccine 29+ Years old  Completed   INFLUENZA VACCINE  Completed   Zoster Vaccines- Shingrix  Completed   HPV VACCINES  Aged Out   Immunization History  Administered Date(s) Administered   Fluad Quad(high Dose 65+) 04/26/2020   Influenza Split 03/19/2012   Influenza, High Dose Seasonal PF 04/21/2013, 03/26/2017, 03/31/2018, 04/29/2019, 04/24/2021   Influenza,inj,Quad PF,6+ Mos 04/07/2015   Influenza-Unspecified 04/02/2016   PFIZER(Purple Top)SARS-COV-2 Vaccination 07/14/2019, 08/04/2019, 04/26/2020   Pneumococcal Conjugate-13 05/25/2014   Pneumococcal Polysaccharide-23 04/30/2006   Tdap 05/01/2010, 06/02/2020   Zoster Recombinat (Shingrix) 04/29/2019, 06/29/2019   Zoster, Live 04/03/2006    These are the patient goals that we discussed:  Goals Addressed               This Visit's Progress     Patient Stated (pt-stated)        Patient that he would like to continue to have a regular routine on his treadmill and his total gym.         This is a list of Health Maintenance Items that are overdue or due now: There are no preventive care reminders to display for this patient.    Orders/Referrals Placed Today: No orders of the defined types were placed in this encounter.  (Contact our referral department at [phone] if you have not spoken with someone about your referral appointment within the next 5 days)    Follow-up Plan Follow-up with Hali Marry, MD as planned Medicare wellness visit in one year.  Patient will access AVS on  mychart.     Health Maintenance, Male Adopting a healthy lifestyle and getting preventive care are important in promoting health and wellness. Ask your health care provider about: The right schedule for you to have regular tests and exams. Things you can do on your own to prevent diseases and keep yourself healthy. What should I know about diet, weight, and exercise? Eat a healthy diet  Eat a diet that includes plenty of vegetables, fruits, low-fat dairy products, and lean protein. Do not eat a lot of foods that are high in solid fats, added sugars, or sodium. Maintain a healthy weight Body mass index (BMI) is a measurement that can be used to identify possible weight problems. It estimates body fat based on height and weight. Your health care provider can help determine your BMI and help you achieve or maintain a healthy weight. Get regular exercise Get regular exercise. This is one of the most important things you can do for your health. Most adults should: Exercise for at least 150 minutes each week. The exercise should increase your heart rate and make you sweat (moderate-intensity exercise). Do strengthening exercises at least twice a week. This is in addition to the moderate-intensity exercise. Spend less time sitting. Even light physical activity can be beneficial. Watch cholesterol and blood lipids Have your blood tested for lipids and cholesterol at 86 years of age, then have this test every 5 years. You may need to have your cholesterol levels checked more often if: Your lipid or cholesterol levels are high. You  are older than 86 years of age. You are at high risk for heart disease. What should I know about cancer screening? Many types of cancers can be detected early and may often be prevented. Depending on your health history and family history, you may need to have cancer screening at various ages. This may include screening for: Colorectal cancer. Prostate cancer. Skin  cancer. Lung cancer. What should I know about heart disease, diabetes, and high blood pressure? Blood pressure and heart disease High blood pressure causes heart disease and increases the risk of stroke. This is more likely to develop in people who have high blood pressure readings or are overweight. Talk with your health care provider about your target blood pressure readings. Have your blood pressure checked: Every 3-5 years if you are 54-30 years of age. Every year if you are 68 years old or older. If you are between the ages of 64 and 78 and are a current or former smoker, ask your health care provider if you should have a one-time screening for abdominal aortic aneurysm (AAA). Diabetes Have regular diabetes screenings. This checks your fasting blood sugar level. Have the screening done: Once every three years after age 29 if you are at a normal weight and have a low risk for diabetes. More often and at a younger age if you are overweight or have a high risk for diabetes. What should I know about preventing infection? Hepatitis B If you have a higher risk for hepatitis B, you should be screened for this virus. Talk with your health care provider to find out if you are at risk for hepatitis B infection. Hepatitis C Blood testing is recommended for: Everyone born from [birth year] through 1963. Anyone with known risk factors for hepatitis C. Sexually transmitted infections (STIs) You should be screened each year for STIs, including gonorrhea and chlamydia, if: You are sexually active and are younger than 86 years of age. You are older than 86 years of age and your health care provider tells you that you are at risk for this type of infection. Your sexual activity has changed since you were last screened, and you are at increased risk for chlamydia or gonorrhea. Ask your health care provider if you are at risk. Ask your health care provider about whether you are at high risk for HIV. Your health  care provider may recommend a prescription medicine to help prevent HIV infection. If you choose to take medicine to prevent HIV, you should first get tested for HIV. You should then be tested every 3 months for as long as you are taking the medicine. Follow these instructions at home: Alcohol use Do not drink alcohol if your health care provider tells you not to drink. If you drink alcohol: Limit how much you have to 0-2 drinks a day. Know how much alcohol is in your drink. In the [place] one drink equals one 12 oz bottle of beer (355 mL), one 5 oz glass of wine (148 mL), or one 1 oz glass of hard liquor (44 mL). Lifestyle Do not use any products that contain nicotine or tobacco. These products include cigarettes, chewing tobacco, and vaping devices, such as e-cigarettes. If you need help quitting, ask your health care provider. Do not use street drugs. Do not share needles. Ask your health care provider for help if you need support or information about quitting drugs. General instructions Schedule regular health, dental, and eye exams. Stay current with your vaccines. Tell your health care  provider if: You often feel depressed. You have ever been abused or do not feel safe at home. Summary Adopting a healthy lifestyle and getting preventive care are important in promoting health and wellness. Follow your health care provider's instructions about healthy diet, exercising, and getting tested or screened for diseases. Follow your health care provider's instructions on monitoring your cholesterol and blood pressure. This information is not intended to replace advice given to you by your health care provider. Make sure you discuss any questions you have with your health care provider. Document Revised: 10/31/2020 Document Reviewed: 10/31/2020 Elsevier Patient Education  [place].

## 2021-08-04 NOTE — Progress Notes (Signed)
MEDICARE ANNUAL WELLNESS VISIT  08/04/2021  Telephone Visit Disclaimer This Medicare AWV was conducted by telephone due to national recommendations for restrictions regarding the COVID-19 Pandemic (e.g. social distancing).  I verified, using two identifiers, that I am speaking with Patrick Perry or their authorized healthcare agent. I discussed the limitations, risks, security, and privacy concerns of performing an evaluation and management service by telephone and the potential availability of an in-person appointment in the future. The patient expressed understanding and agreed to proceed.  Location of Patient: Home Location of Provider (nurse):  Provider home  Subjective:    Patrick Perry is a 88 y.o. male patient of Metheney, Rene Kocher, MD who had a Medicare Annual Wellness Visit today via telephone. Samantha is Retired and lives with their spouse. he has 2 children. he reports that he is socially active and does interact with friends/family regularly. he is moderately physically active and enjoys  He enjoys wood working and Haematologist..  Patient Care Team: Hali Marry, MD as PCP - General (Family Medicine) Renelda Loma, [place] (Optometry) Jari Pigg, MD as Attending Physician (Dermatology) Luretha Rued, RN as Case Manager  Advanced Directives 08/04/2021 06/15/2020 06/15/2019 06/11/2018 08/10/2017 06/10/2017 05/25/2014  Does Patient Have a Medical Advance Directive? Yes Yes Yes Yes No Yes Yes  Type of Advance Directive Living will;Healthcare Power of [place];Living will [place];Living will [place];Living will - [place];Living will Living will;Healthcare Power of Attorney  Does patient want to make changes to medical advance directive? - No - Patient declined No - Patient declined No - Patient declined - - No - Patient declined  Copy of [place] in Chart? Yes  - validated most recent copy scanned in chart (See row information) Yes - validated most recent copy scanned in chart (See row information) No - copy requested No - copy requested - - No - copy requested  Would patient like information on creating a medical advance directive? - No - Patient declined - - - - [facility] Utilization Over the Past 12 Months: # of hospitalizations or ER visits: 0 # of surgeries: 0  Review of Systems    Patient reports that his overall health is unchanged compared to last year.  History obtained from chart review and the patient  Patient Reported Readings (BP, Pulse, CBG, Weight, etc) none  Pain Assessment Pain : No/denies pain     Current Medications & Allergies (verified) Allergies as of 08/04/2021       Reactions   Ampicillin Other (See Comments)   Clindamycin/lincomycin    rash        Medication List        Accurate as of August 04, 2021  3:06 PM. If you have any questions, ask your nurse or doctor.          b complex vitamins capsule Take 1 capsule by mouth daily.   COQ10 PO Take 5 mg by mouth daily.   GLUCOSAMINE-CHONDROITIN-CA-D3 PO Take 2,000 mg by mouth.   hydrochlorothiazide 25 MG tablet Commonly known as: HYDRODIURIL TAKE 1/2 TABLET BY MOUTH EVERY DAY   lovastatin 40 MG tablet Commonly known as: MEVACOR TAKE 1 TABLET BY MOUTH EVERYDAY AT BEDTIME   Prostate Caps Take 1 tablet by mouth 2 (two) times daily.   Vitamin D-3 25 MCG (1000 UT) Caps Take by mouth 3 (three) times daily.  History (reviewed): Past Medical History:  Diagnosis Date   Cataract    H/O colonoscopy with polypectomy 06/12/11   repeat in 5 years    History of tonsillectomy 1946   Past Surgical History:  Procedure Laterality Date   BUNIONECTOMY  10/07/2012   Hammer toe   CATARACT EXTRACTION, BILATERAL  2009   HEMORRHOID SURGERY     REPAIR KNEE LIGAMENT  1978   ROTATOR CUFF REPAIR  2008   left   TONSILLECTOMY  1946    Family History  Problem Relation Age of Onset   Prostate cancer Brother    Cancer Sister    Multiple sclerosis Sister    Social History   Socioeconomic History   Marital status: Married    Spouse name: [name]   Number of children: 2   Years of education: BA   Highest education level: Bachelor's degree (e.g., BA, AB, BS)  Occupational History   Occupation: Retired.    Comment: engineering  Tobacco Use   Smoking status: Never   Smokeless tobacco: Never  Vaping Use   Vaping Use: Never used  Substance and Sexual Activity   Alcohol use: No   Drug use: No   Sexual activity: Yes  Other Topics Concern   Not on file  Social History Narrative   Lives with his wife. He has two children. He enjoys wood working and Haematologist.   Social Determinants of Health   Financial Resource Strain: Low Risk    Difficulty of Paying Living Expenses: Not hard at all  Food Insecurity: No Food Insecurity   Worried About Charity fundraiser in the Last Year: Never true   [place] in the Last Year: Never true  Transportation Needs: No Transportation Needs   Lack of Transportation (Medical): No   Lack of Transportation (Non-Medical): No  Physical Activity: Sufficiently Active   Days of Exercise per Week: 5 days   Minutes of Exercise per Session: 50 min  Stress: No Stress Concern Present   Feeling of Stress : Not at all  Social Connections: Socially Integrated   Frequency of Communication with Friends and Family: More than three times a week   Frequency of Social Gatherings with Friends and Family: More than three times a week   Attends Religious Services: More than 4 times per year   Active Member of [employer] or Organizations: Yes   Attends Archivist Meetings: More than 4 times per year   Marital Status: Married    Activities of Daily Living In your present state of health, do you have any difficulty performing the following activities: 08/04/2021  Hearing? Y  Comment bilateral  hearing aids  Vision? N  Difficulty concentrating or making decisions? N  Walking or climbing stairs? N  Dressing or bathing? N  Doing errands, shopping? N  Preparing Food and eating ? N  Using the Toilet? N  In the past six months, have you accidently leaked urine? N  Do you have problems with loss of bowel control? N  Managing your Medications? N  Managing your Finances? N  Housekeeping or managing your Housekeeping? N  Some recent data might be hidden    Patient Education/ Literacy How often do you need to have someone help you when you read instructions, pamphlets, or other written materials from your doctor or pharmacy?: 1 - Never What is the last grade level you completed in school?: Bachelor's degree  Exercise Current Exercise Habits: Home exercise routine, Type of exercise:  treadmill, Time (Minutes): 40, Frequency (Times/Week): 5, Weekly Exercise (Minutes/Week): 200, Intensity: Moderate, Exercise limited by: None identified  Diet Patient reports consuming 3 meals a day and 1 snack(s) a day Patient reports that his primary diet is: Regular Patient reports that she does have regular access to food.   Depression Screen PHQ 2/9 Scores 08/04/2021 06/01/2021 06/15/2020 05/31/2020 06/15/2019 11/25/2018 06/11/2018  PHQ - 2 Score 0 0 0 0 0 0 0     Fall Risk Fall Risk  08/04/2021 06/01/2021 06/01/2021 01/18/2021 11/29/2020  Falls in the past year? 1 1 1 1 1   Number falls in past yr: 0 1 1 0 -  Comment - - - slipped on ice in February -  Injury with Fall? 1 0 0 1 1  Risk for fall due to : History of fall(s);Other (Comment) - - No Fall Risks No Fall Risks  Risk for fall due to: Comment slipped on ICE - - patient slipped on ice, no other indications for fall risk -  Follow up Falls evaluation completed;Education provided;Falls prevention discussed - Falls prevention discussed;Falls evaluation completed - Falls prevention discussed;Falls evaluation completed     Objective:  Patrick Perry seemed alert and oriented and he participated appropriately during our telephone visit.  Blood Pressure Weight BMI  BP Readings from Last 3 Encounters:  06/01/21 139/69  11/29/20 122/73  07/22/20 140/70   Wt Readings from Last 3 Encounters:  06/01/21 163 lb (73.9 kg)  11/29/20 158 lb (71.7 kg)  07/22/20 162 lb (73.5 kg)   BMI Readings from Last 1 Encounters:  06/01/21 23.73 kg/m    *Unable to obtain current vital signs, weight, and BMI due to telephone visit type  Hearing/Vision  Elenore Rota did not seem to have difficulty with hearing/understanding during the telephone conversation Reports that he has not had a formal eye exam by an eye care professional within the past year Reports that he has not had a formal hearing evaluation within the past year *Unable to fully assess hearing and vision during telephone visit type  Cognitive Function: 6CIT Screen 08/04/2021 06/15/2020 06/15/2019 06/11/2018 06/10/2017  What Year? 0 points 0 points 0 points 0 points 0 points  What month? 0 points 0 points 0 points 0 points 0 points  What time? 0 points 0 points 0 points 0 points 0 points  Count back from 20 0 points 0 points 0 points 0 points 0 points  Months in reverse 0 points 0 points 0 points 0 points 0 points  Repeat phrase 0 points 0 points 0 points 2 points 0 points  Total Score 0 0 0 2 0   (Normal:0-7, Significant for Dysfunction: >8)  Normal Cognitive Function Screening: Yes   Immunization & Health Maintenance Record Immunization History  Administered Date(s) Administered   Fluad Quad(high Dose 65+) 04/26/2020   Influenza Split 03/19/2012   Influenza, High Dose Seasonal PF 04/21/2013, 03/26/2017, 03/31/2018, 04/29/2019, 04/24/2021   Influenza,inj,Quad PF,6+ Mos 04/07/2015   Influenza-Unspecified 04/02/2016   PFIZER(Purple Top)SARS-COV-2 Vaccination 07/14/2019, 08/04/2019, 04/26/2020   Pneumococcal Conjugate-13 05/25/2014   Pneumococcal Polysaccharide-23 04/30/2006    Tdap 05/01/2010, 06/02/2020   Zoster Recombinat (Shingrix) 04/29/2019, 06/29/2019   Zoster, Live 04/03/2006    Health Maintenance  Topic Date Due   COVID-19 Vaccine (4 - Booster for Pfizer series) 06/21/2020   TETANUS/TDAP  06/02/2030   Pneumonia Vaccine 10+ Years old  Completed   INFLUENZA VACCINE  Completed   Zoster Vaccines- Shingrix  Completed   HPV VACCINES  Aged Out       Assessment  This is a routine wellness examination for Patrick Perry.  Health Maintenance: Due or Overdue Health Maintenance Due  Topic Date Due   COVID-19 Vaccine (4 - Booster for Pfizer series) 06/21/2020    Patrick Perry does not need a referral for Community Assistance: Care Management:   no Social Work:    no Prescription Assistance:  no Nutrition/Diabetes Education:  no   Plan:  Personalized Goals  Goals Addressed               This Visit's Progress     Patient Stated (pt-stated)        Patient that he would like to continue to have a regular routine on his treadmill and his total gym.       Personalized Health Maintenance & Screening Recommendations  There are no preventive care reminders to display for this patient.  Lung Cancer Screening Recommended: no (Low Dose CT Chest recommended if Age 64-80 years, 30 pack-year currently smoking OR have quit w/in past 15 years) Hepatitis C Screening recommended: no HIV Screening recommended: no  Advanced Directives: Written information was not prepared per patient's request.  Referrals & Orders No orders of the defined types were placed in this encounter.   Follow-up Plan Follow-up with Hali Marry, MD as planned Medicare wellness visit in one year.  Patient will access AVS on mychart.   I have personally reviewed and noted the following in the patients chart:   Medical and social history Use of alcohol, tobacco or illicit drugs  Current medications and supplements Functional ability and status Nutritional  status Physical activity Advanced directives List of other physicians Hospitalizations, surgeries, and ER visits in previous 12 months Vitals Screenings to include cognitive, depression, and falls Referrals and appointments  In addition, I have reviewed and discussed with Patrick Perry certain preventive protocols, quality metrics, and best practice recommendations. A written personalized care plan for preventive services as well as general preventive health recommendations is available and can be mailed to the patient at his request.      Tinnie Gens, RN  08/04/2021

## 2021-10-21 ENCOUNTER — Other Ambulatory Visit: Payer: Self-pay | Admitting: Family Medicine

## 2021-11-30 ENCOUNTER — Encounter: Payer: Self-pay | Admitting: Family Medicine

## 2021-11-30 ENCOUNTER — Ambulatory Visit (INDEPENDENT_AMBULATORY_CARE_PROVIDER_SITE_OTHER): Payer: Medicare HMO | Admitting: Family Medicine

## 2021-11-30 VITALS — BP 131/72 | HR 52 | Resp 16 | Ht 69.5 in | Wt 159.0 lb

## 2021-11-30 DIAGNOSIS — Z23 Encounter for immunization: Secondary | ICD-10-CM

## 2021-11-30 DIAGNOSIS — D692 Other nonthrombocytopenic purpura: Secondary | ICD-10-CM

## 2021-11-30 DIAGNOSIS — E7800 Pure hypercholesterolemia, unspecified: Secondary | ICD-10-CM

## 2021-11-30 DIAGNOSIS — I10 Essential (primary) hypertension: Secondary | ICD-10-CM | POA: Diagnosis not present

## 2021-11-30 MED ORDER — HYDROCHLOROTHIAZIDE 25 MG PO TABS: 12.5000 mg | ORAL_TABLET | Freq: Every day | ORAL | 1 refills | Status: DC

## 2021-11-30 NOTE — Progress Notes (Signed)
Established Patient Office Visit  Subjective   Patient ID: Patrick Perry, male    DOB: May 28, 1933  Age: 88 y.o. MRN: [id number]  Chief Complaint  Patient presents with   Hypertension    Follow up     HPI  Hypertension- Pt denies chest pain, SOB, dizziness, or heart palpitations.  Taking meds as directed w/o problems.  Denies medication side effects.  He is still exercising regularly tries to be physically active.  He also does resistance training.  He did try getting on the treadmill back in January.  He was using an old pair of trainer shoes that have a heel lift that he needs on his left leg.  But his knees were starting to hurt by the end of the week so he discontinued it.  Hyperlipidemia - tolerating stating well with no myalgias or significant side effects.  Lab Results  Component Value Date   CHOL 157 06/01/2021   HDL 60 06/01/2021   LDLCALC 78 06/01/2021   TRIG 111 06/01/2021   CHOLHDL 2.6 06/01/2021        ROS    Objective:     BP 131/72   Pulse (!) 52   Resp 16   Ht 5' 9.5" (1.765 m)   Wt 159 lb (72.1 kg)   SpO2 99%   BMI 23.14 kg/m    Physical Exam Constitutional:      Appearance: He is well-developed.  HENT:     Head: Normocephalic and atraumatic.     Right Ear: External ear normal.     Left Ear: External ear normal.  Eyes:     Conjunctiva/sclera: Conjunctivae normal.  Neck:     Vascular: No carotid bruit.  Cardiovascular:     Rate and Rhythm: Normal rate and regular rhythm.     Pulses: Normal pulses.     Comments: 2/6 SEM Pulmonary:     Effort: Pulmonary effort is normal.     Breath sounds: Normal breath sounds.  Skin:    General: Skin is warm and dry.     Comments: + bruising on forearms.   Neurological:     Mental Status: He is alert and oriented to person, place, and time.  Psychiatric:        Behavior: Behavior normal.      No results found for any visits on 11/30/21.    The ASCVD Risk score (Arnett DK, et al., 2019)  failed to calculate for the following reasons:   The 2019 ASCVD risk score is only valid for ages 93 to 44    Assessment & Plan:   Problem List Items Addressed This Visit       Cardiovascular and Mediastinum   Purpura senilis ([place])    Stable.       Relevant Medications   hydrochlorothiazide (HYDRODIURIL) 25 MG tablet   Essential hypertension, benign - Primary   Relevant Medications   hydrochlorothiazide (HYDRODIURIL) 25 MG tablet   Other Relevant Orders   BASIC METABOLIC PANEL WITH GFR     Other   Hyperlipidemia    Last LDL was under 100 in December.  He is doing well with his medication without any problems or side effects.      Relevant Medications   hydrochlorothiazide (HYDRODIURIL) 25 MG tablet   Other Visit Diagnoses     Need for pneumococcal 20-valent conjugate vaccination       Relevant Orders   Pneumococcal conjugate vaccine 20-valent (Prevnar 20) (Completed)  Prevnar 20 given today.  No follow-ups on file.    Beatrice Lecher, MD

## 2021-11-30 NOTE — Assessment & Plan Note (Signed)
Stable

## 2021-11-30 NOTE — Assessment & Plan Note (Signed)
Last LDL was under 100 in December.  He is doing well with his medication without any problems or side effects.

## 2021-12-01 LAB — BASIC METABOLIC PANEL WITH GFR
BUN: 17 mg/dL (ref 7–25)
CO2: 29 mmol/L (ref 20–32)
Calcium: 9.9 mg/dL (ref 8.6–10.3)
Chloride: 104 mmol/L (ref 98–110)
Creat: 1.05 mg/dL (ref 0.70–1.22)
Glucose, Bld: 93 mg/dL (ref 65–99)
Potassium: 4.9 mmol/L (ref 3.5–5.3)
Sodium: 142 mmol/L (ref 135–146)
eGFR: 69 mL/min/{1.73_m2} (ref >=60)

## 2021-12-01 NOTE — Progress Notes (Signed)
Your lab work is within acceptable range and there are no concerning findings.   ?

## 2021-12-12 DIAGNOSIS — L570 Actinic keratosis: Secondary | ICD-10-CM | POA: Diagnosis not present

## 2021-12-12 DIAGNOSIS — D225 Melanocytic nevi of trunk: Secondary | ICD-10-CM | POA: Diagnosis not present

## 2021-12-12 DIAGNOSIS — D2262 Melanocytic nevi of left upper limb, including shoulder: Secondary | ICD-10-CM | POA: Diagnosis not present

## 2021-12-12 DIAGNOSIS — L821 Other seborrheic keratosis: Secondary | ICD-10-CM | POA: Diagnosis not present

## 2021-12-12 DIAGNOSIS — D2261 Melanocytic nevi of right upper limb, including shoulder: Secondary | ICD-10-CM | POA: Diagnosis not present

## 2021-12-12 DIAGNOSIS — L723 Sebaceous cyst: Secondary | ICD-10-CM | POA: Diagnosis not present

## 2021-12-12 DIAGNOSIS — D0362 Melanoma in situ of left upper limb, including shoulder: Secondary | ICD-10-CM | POA: Diagnosis not present

## 2021-12-12 DIAGNOSIS — D224 Melanocytic nevi of scalp and neck: Secondary | ICD-10-CM | POA: Diagnosis not present

## 2021-12-12 DIAGNOSIS — D2230 Melanocytic nevi of unspecified part of face: Secondary | ICD-10-CM | POA: Diagnosis not present

## 2021-12-12 DIAGNOSIS — L578 Other skin changes due to chronic exposure to nonionizing radiation: Secondary | ICD-10-CM | POA: Diagnosis not present

## 2022-07-03 DIAGNOSIS — D225 Melanocytic nevi of trunk: Secondary | ICD-10-CM | POA: Diagnosis not present

## 2022-07-03 DIAGNOSIS — L570 Actinic keratosis: Secondary | ICD-10-CM | POA: Diagnosis not present

## 2022-07-03 DIAGNOSIS — D224 Melanocytic nevi of scalp and neck: Secondary | ICD-10-CM | POA: Diagnosis not present

## 2022-07-03 DIAGNOSIS — L821 Other seborrheic keratosis: Secondary | ICD-10-CM | POA: Diagnosis not present

## 2022-07-03 DIAGNOSIS — L723 Sebaceous cyst: Secondary | ICD-10-CM | POA: Diagnosis not present

## 2022-07-03 DIAGNOSIS — D0362 Melanoma in situ of left upper limb, including shoulder: Secondary | ICD-10-CM | POA: Diagnosis not present

## 2022-07-03 DIAGNOSIS — D2230 Melanocytic nevi of unspecified part of face: Secondary | ICD-10-CM | POA: Diagnosis not present

## 2022-07-03 DIAGNOSIS — L578 Other skin changes due to chronic exposure to nonionizing radiation: Secondary | ICD-10-CM | POA: Diagnosis not present

## 2022-07-03 DIAGNOSIS — D2261 Melanocytic nevi of right upper limb, including shoulder: Secondary | ICD-10-CM | POA: Diagnosis not present

## 2022-07-03 DIAGNOSIS — D2262 Melanocytic nevi of left upper limb, including shoulder: Secondary | ICD-10-CM | POA: Diagnosis not present

## 2022-07-16 ENCOUNTER — Other Ambulatory Visit: Payer: Self-pay | Admitting: Family Medicine

## 2022-07-16 DIAGNOSIS — I10 Essential (primary) hypertension: Secondary | ICD-10-CM

## 2022-08-06 ENCOUNTER — Ambulatory Visit (INDEPENDENT_AMBULATORY_CARE_PROVIDER_SITE_OTHER): Payer: Medicare HMO | Admitting: Family Medicine

## 2022-08-06 DIAGNOSIS — Z0000 Encounter for general adult medical examination without abnormal findings: Secondary | ICD-10-CM

## 2022-08-06 NOTE — Progress Notes (Signed)
MEDICARE ANNUAL WELLNESS VISIT  08/06/2022  Telephone Visit Disclaimer This Medicare AWV was conducted by telephone due to national recommendations for restrictions regarding the COVID-19 Pandemic (e.g. social distancing).  I verified, using two identifiers, that I am speaking with Patrick Perry or their authorized healthcare agent. I discussed the limitations, risks, security, and privacy concerns of performing an evaluation and management service by telephone and the potential availability of an in-person appointment in the future. The patient expressed understanding and agreed to proceed.  Location of Patient: Home Location of Provider (nurse):  in the office  Subjective:    Patrick Perry is a 89 y.o. male patient of Metheney, Rene Kocher, MD who had a Medicare Annual Wellness Visit today via telephone. [name] is Retired and lives with their spouse. he has 2 children. he reports that he is socially active and does interact with friends/family regularly. he is moderately physically active and enjoys wood working and yard work.  Patient Care Team: Hali Marry, MD as PCP - General (Family Medicine) Renelda Loma, [place] (Optometry) Jari Pigg, MD as Attending Physician (Dermatology)     08/06/2022    9:00 AM 08/04/2021    2:52 PM 06/15/2020    9:03 AM 06/15/2019    9:09 AM 06/11/2018    9:04 AM 08/10/2017   10:28 AM 06/10/2017   11:22 AM  Advanced Directives  Does Patient Have a Medical Advance Directive? Yes Yes Yes Yes Yes No Yes  Type of Advance Directive Living will;Healthcare Power of Attorney Living will;Healthcare Power of [place];Living will [place];Living will [place];Living will  [place];Living will  Does patient want to make changes to medical advance directive? No - Patient declined  No - Patient declined No - Patient declined No - Patient declined    Copy of  [place] in Chart? Yes - validated most recent copy scanned in chart (See row information) Yes - validated most recent copy scanned in chart (See row information) Yes - validated most recent copy scanned in chart (See row information) No - copy requested No - copy requested    Would patient like information on creating a medical advance directive?   No - Patient declined        Hospital Utilization Over the Past 12 Months: # of hospitalizations or ER visits: 0 # of surgeries: 0  Review of Systems    Patient reports that his overall health is unchanged compared to last year.  History obtained from chart review and the patient  Patient Reported Readings (BP, Pulse, CBG, Weight, etc) none  Pain Assessment Pain : No/denies pain     Current Medications & Allergies (verified) Allergies as of 08/06/2022       Reactions   Ampicillin Other (See Comments)   Clindamycin/lincomycin    rash        Medication List        Accurate as of August 06, 2022  9:09 AM. If you have any questions, ask your nurse or doctor.          Ascorbic Acid 100 MG Chew 1 tablet Orally Once a day for 30 day(s)   b complex vitamins capsule Take 1 capsule by mouth daily.   COQ10 PO Take 5 mg by mouth daily.   GLUCOSAMINE-CHONDROITIN-CA-D3 PO Take 2,000 mg by mouth.   hydrochlorothiazide 25 MG tablet Commonly known as: HYDRODIURIL TAKE 1/2 TABLET BY  MOUTH EVERY DAY   lovastatin 40 MG tablet Commonly known as: MEVACOR TAKE 1 TABLET BY MOUTH EVERYDAY AT BEDTIME   Vitamin D-3 25 MCG (1000 UT) Caps Take by mouth 3 (three) times daily.        History (reviewed): Past Medical History:  Diagnosis Date   Cataract    H/O colonoscopy with polypectomy 06/12/11   repeat in 5 years    History of tonsillectomy 1946   Past Surgical History:  Procedure Laterality Date   BUNIONECTOMY  10/07/2012   Hammer toe   CATARACT EXTRACTION, BILATERAL  2009   HEMORRHOID SURGERY      REPAIR KNEE LIGAMENT  1978   ROTATOR CUFF REPAIR  2008   left   TONSILLECTOMY  1946   Family History  Problem Relation Age of Onset   Prostate cancer Brother    Cancer Sister    Multiple sclerosis Sister    Social History   Socioeconomic History   Marital status: Married    Spouse name: [name]   Number of children: 2   Years of education: BA   Highest education level: Bachelor's degree (e.g., BA, AB, BS)  Occupational History   Occupation: Retired.    Comment: engineering  Tobacco Use   Smoking status: Never   Smokeless tobacco: Never  Vaping Use   Vaping Use: Never used  Substance and Sexual Activity   Alcohol use: No   Drug use: No   Sexual activity: Yes  Other Topics Concern   Not on file  Social History Narrative   Lives with his wife. He has two children. He enjoys wood working and Haematologist.   Social Determinants of Health   Financial Resource Strain: Low Risk  (08/06/2022)   Overall Financial Resource Strain (CARDIA)    Difficulty of Paying Living Expenses: Not hard at all  Food Insecurity: No Food Insecurity (08/06/2022)   Hunger Vital Sign    Worried About Running Out of Food in the Last Year: Never true    Ran Out of Food in the Last Year: Never true  Transportation Needs: No Transportation Needs (08/06/2022)   PRAPARE - Hydrologist (Medical): No    Lack of Transportation (Non-Medical): No  Physical Activity: Sufficiently Active (08/06/2022)   Exercise Vital Sign    Days of Exercise per Week: 5 days    Minutes of Exercise per Session: 40 min  Stress: No Stress Concern Present (08/06/2022)   [place]    Feeling of Stress : Not at all  Social Connections: [place] (08/06/2022)   Social Connection and Isolation Panel [NHANES]    Frequency of Communication with Friends and Family: More than three times a week    Frequency of Social Gatherings with Friends  and Family: Twice a week    Attends Religious Services: More than 4 times per year    Active Member of [employer] or Organizations: Yes    Attends Archivist Meetings: More than 4 times per year    Marital Status: Married    Activities of Daily Living    08/06/2022    9:03 AM  In your present state of health, do you have any difficulty performing the following activities:  Hearing? 1  Comment wears bilateral hearing aids.  Vision? 0  Difficulty concentrating or making decisions? 0  Walking or climbing stairs? 0  Dressing or bathing? 0  Doing errands, shopping? 0  Preparing Food  and eating ? N  Using the Toilet? N  In the past six months, have you accidently leaked urine? N  Do you have problems with loss of bowel control? N  Managing your Medications? N  Managing your Finances? N  Housekeeping or managing your Housekeeping? N    Patient Education/ Literacy How often do you need to have someone help you when you read instructions, pamphlets, or other written materials from your doctor or pharmacy?: 1 - Never What is the last grade level you completed in school?: Bachelor's degre  Exercise Current Exercise Habits: Home exercise routine, Type of exercise: treadmill;strength training/weights, Time (Minutes): 40, Frequency (Times/Week): 5, Weekly Exercise (Minutes/Week): 200, Intensity: Moderate, Exercise limited by: None identified  Diet Patient reports consuming 3 meals a day and 1 snack(s) a day Patient reports that his primary diet is: Regular Patient reports that she does have regular access to food.   Depression Screen    08/06/2022    9:00 AM 11/30/2021    9:03 AM 08/04/2021    2:53 PM 06/01/2021   11:04 AM 06/15/2020    9:09 AM 05/31/2020    9:18 AM 06/15/2019    9:11 AM  PHQ 2/9 Scores  PHQ - 2 Score 0 0 0 0 0 0 0     Fall Risk    08/06/2022    9:00 AM 11/30/2021    9:03 AM 08/04/2021    2:52 PM 06/01/2021   11:04 AM 06/01/2021   10:59 AM  Fall Risk   Falls  in the past year? 0 0 1 1 1  $ Number falls in past yr: 0 0 0 1 1  Injury with Fall? 0 0 1 0 0  Risk for fall due to : No Fall Risks No Fall Risks History of fall(s);Other (Comment)    Risk for fall due to: Comment   slipped on ICE    Follow up Falls evaluation completed Falls prevention discussed;Falls evaluation completed Falls evaluation completed;Education provided;Falls prevention discussed  Falls prevention discussed;Falls evaluation completed     Objective:  Patrick Perry seemed alert and oriented and he participated appropriately during our telephone visit.  Blood Pressure Weight BMI  BP Readings from Last 3 Encounters:  11/30/21 131/72  06/01/21 139/69  11/29/20 122/73   Wt Readings from Last 3 Encounters:  11/30/21 159 lb (72.1 kg)  06/01/21 163 lb (73.9 kg)  11/29/20 158 lb (71.7 kg)   BMI Readings from Last 1 Encounters:  11/30/21 23.14 kg/m    *Unable to obtain current vital signs, weight, and BMI due to telephone visit type  Hearing/Vision  Elenore Rota did not seem to have difficulty with hearing/understanding during the telephone conversation Reports that he has not had a formal eye exam by an eye care professional within the past year Reports that he has not had a formal hearing evaluation within the past year *Unable to fully assess hearing and vision during telephone visit type  Cognitive Function:    08/06/2022    9:06 AM 08/04/2021    3:04 PM 06/15/2020    9:07 AM 06/15/2019    9:16 AM 06/11/2018    9:10 AM  6CIT Screen  What Year? 0 points 0 points 0 points 0 points 0 points  What month? 0 points 0 points 0 points 0 points 0 points  What time? 0 points 0 points 0 points 0 points 0 points  Count back from 20 0 points 0 points 0 points 0 points 0 points  Months in reverse 0 points 0 points 0 points 0 points 0 points  Repeat phrase 0 points 0 points 0 points 0 points 2 points  Total Score 0 points 0 points 0 points 0 points 2 points   (Normal:0-7,  Significant for Dysfunction: >8)  Normal Cognitive Function Screening: Yes   Immunization & Health Maintenance Record Immunization History  Administered Date(s) Administered   Fluad Quad(high Dose 65+) 04/26/2020, 03/19/2022   Influenza Split 03/19/2012   Influenza, High Dose Seasonal PF 04/21/2013, 03/26/2017, 03/31/2018, 04/29/2019, 04/24/2021   Influenza,inj,Quad PF,6+ Mos 04/07/2015   Influenza-Unspecified 04/02/2016   PFIZER Comirnaty(Gray Top)Covid-19 Tri-Sucrose Vaccine 03/23/2022   PFIZER(Purple Top)SARS-COV-2 Vaccination 07/14/2019, 08/04/2019, 04/26/2020   PNEUMOCOCCAL CONJUGATE-20 11/30/2021   Pneumococcal Conjugate-13 05/25/2014   Pneumococcal Polysaccharide-23 04/30/2006   Tdap 05/01/2010, 06/02/2020   Zoster Recombinat (Shingrix) 04/29/2019, 06/29/2019   Zoster, Live 04/03/2006    Health Maintenance  Topic Date Due   COVID-19 Vaccine (5 - 2023-24 season) 08/22/2022 (Originally 05/18/2022)   Medicare Annual Wellness (AWV)  08/07/2023   DTaP/Tdap/Td (3 - Td or Tdap) 06/02/2030   Pneumonia Vaccine 99+ Years old  Completed   INFLUENZA VACCINE  Completed   Zoster Vaccines- Shingrix  Completed   HPV VACCINES  Aged Out       Assessment  This is a routine wellness examination for Patrick Perry.  Health Maintenance: Due or Overdue There are no preventive care reminders to display for this patient.   Patrick Perry does not need a referral for Community Assistance: Care Management:   no Social Work:    no Prescription Assistance:  no Nutrition/Diabetes Education:  no   Plan:  Personalized Goals  Goals Addressed               This Visit's Progress     Patient Stated (pt-stated)        Patient stated that he would like to maintain his exercise routine.       Personalized Health Maintenance & Screening Recommendations  There are no preventive care reminders to display for this patient.  Lung Cancer Screening Recommended: no (Low Dose CT Chest  recommended if Age 50-80 years, 30 pack-year currently smoking OR have quit w/in past 15 years) Hepatitis C Screening recommended: no HIV Screening recommended: no  Advanced Directives: Written information was not prepared per patient's request.  Referrals & Orders No orders of the defined types were placed in this encounter.   Follow-up Plan Follow-up with Hali Marry, MD as planned Medicare wellness visit in one year.  Patient will access AVS on my chart.   I have personally reviewed and noted the following in the patient's chart:   Medical and social history Use of alcohol, tobacco or illicit drugs  Current medications and supplements Functional ability and status Nutritional status Physical activity Advanced directives List of other physicians Hospitalizations, surgeries, and ER visits in previous 12 months Vitals Screenings to include cognitive, depression, and falls Referrals and appointments  In addition, I have reviewed and discussed with Patrick Perry certain preventive protocols, quality metrics, and best practice recommendations. A written personalized care plan for preventive services as well as general preventive health recommendations is available and can be mailed to the patient at his request.      Tinnie Gens, RN BSN  08/06/2022

## 2022-08-06 NOTE — Patient Instructions (Addendum)
Maintenance Summary and Written Plan of Care  [name] ,  Thank you for allowing me to perform your Medicare Annual Wellness Visit and for your ongoing commitment to your health.   Health Maintenance & Immunization History Health Maintenance  Topic Date Due   COVID-19 Vaccine (5 - 2023-24 season) 08/22/2022 (Originally 05/18/2022)   Medicare Annual Wellness (AWV)  08/07/2023   DTaP/Tdap/Td (3 - Td or Tdap) 06/02/2030   Pneumonia Vaccine 56+ Years old  Completed   INFLUENZA VACCINE  Completed   Zoster Vaccines- Shingrix  Completed   HPV VACCINES  Aged Out   Immunization History  Administered Date(s) Administered   Fluad Quad(high Dose 65+) 04/26/2020, 03/19/2022   Influenza Split 03/19/2012   Influenza, High Dose Seasonal PF 04/21/2013, 03/26/2017, 03/31/2018, 04/29/2019, 04/24/2021   Influenza,inj,Quad PF,6+ Mos 04/07/2015   Influenza-Unspecified 04/02/2016   PFIZER Comirnaty(Gray Top)Covid-19 Tri-Sucrose Vaccine 03/23/2022   PFIZER(Purple Top)SARS-COV-2 Vaccination 07/14/2019, 08/04/2019, 04/26/2020   PNEUMOCOCCAL CONJUGATE-20 11/30/2021   Pneumococcal Conjugate-13 05/25/2014   Pneumococcal Polysaccharide-23 04/30/2006   Tdap 05/01/2010, 06/02/2020   Zoster Recombinat (Shingrix) 04/29/2019, 06/29/2019   Zoster, Live 04/03/2006    These are the patient goals that we discussed:  Goals Addressed               This Visit's Progress     Patient Stated (pt-stated)        Patient stated that he would like to maintain his exercise routine.         This is a list of Health Maintenance Items that are overdue or due now: There are no preventive care reminders to display for this patient.    Orders/Referrals Placed Today: No orders of the defined types were placed in this encounter.  (Contact our referral department at [phone] if you have not spoken with someone about your referral appointment within the next 5 days)     Follow-up Plan Follow-up with Hali Marry, MD as planned Medicare wellness visit in one year.  Patient will access AVS on my chart.      Health Maintenance, Male Adopting a healthy lifestyle and getting preventive care are important in promoting health and wellness. Ask your health care provider about: The right schedule for you to have regular tests and exams. Things you can do on your own to prevent diseases and keep yourself healthy. What should I know about diet, weight, and exercise? Eat a healthy diet  Eat a diet that includes plenty of vegetables, fruits, low-fat dairy products, and lean protein. Do not eat a lot of foods that are high in solid fats, added sugars, or sodium. Maintain a healthy weight Body mass index (BMI) is a measurement that can be used to identify possible weight problems. It estimates body fat based on height and weight. Your health care provider can help determine your BMI and help you achieve or maintain a healthy weight. Get regular exercise Get regular exercise. This is one of the most important things you can do for your health. Most adults should: Exercise for at least 150 minutes each week. The exercise should increase your heart rate and make you sweat (moderate-intensity exercise). Do strengthening exercises at least twice a week. This is in addition to the moderate-intensity exercise. Spend less time sitting. Even light physical activity can be beneficial. Watch cholesterol and blood lipids Have your blood tested for lipids and cholesterol at 87 years of age, then have this test every 5 years. You may  need to have your cholesterol levels checked more often if: Your lipid or cholesterol levels are high. You are older than 87 years of age. You are at high risk for heart disease. What should I know about cancer screening? Many types of cancers can be detected early and may often be prevented. Depending on your health history and family  history, you may need to have cancer screening at various ages. This may include screening for: Colorectal cancer. Prostate cancer. Skin cancer. Lung cancer. What should I know about heart disease, diabetes, and high blood pressure? Blood pressure and heart disease High blood pressure causes heart disease and increases the risk of stroke. This is more likely to develop in people who have high blood pressure readings or are overweight. Talk with your health care provider about your target blood pressure readings. Have your blood pressure checked: Every 3-5 years if you are 27-53 years of age. Every year if you are 35 years old or older. If you are between the ages of 65 and 21 and are a current or former smoker, ask your health care provider if you should have a one-time screening for abdominal aortic aneurysm (AAA). Diabetes Have regular diabetes screenings. This checks your fasting blood sugar level. Have the screening done: Once every three years after age 73 if you are at a normal weight and have a low risk for diabetes. More often and at a younger age if you are overweight or have a high risk for diabetes. What should I know about preventing infection? Hepatitis B If you have a higher risk for hepatitis B, you should be screened for this virus. Talk with your health care provider to find out if you are at risk for hepatitis B infection. Hepatitis C Blood testing is recommended for: Everyone born from [birth year] through 1963. Anyone with known risk factors for hepatitis C. Sexually transmitted infections (STIs) You should be screened each year for STIs, including gonorrhea and chlamydia, if: You are sexually active and are younger than 87 years of age. You are older than 86 years of age and your health care provider tells you that you are at risk for this type of infection. Your sexual activity has changed since you were last screened, and you are at increased risk for chlamydia or  gonorrhea. Ask your health care provider if you are at risk. Ask your health care provider about whether you are at high risk for HIV. Your health care provider may recommend a prescription medicine to help prevent HIV infection. If you choose to take medicine to prevent HIV, you should first get tested for HIV. You should then be tested every 3 months for as long as you are taking the medicine. Follow these instructions at home: Alcohol use Do not drink alcohol if your health care provider tells you not to drink. If you drink alcohol: Limit how much you have to 0-2 drinks a day. Know how much alcohol is in your drink. In the [place] one drink equals one 12 oz bottle of beer (355 mL), one 5 oz glass of wine (148 mL), or one 1 oz glass of hard liquor (44 mL). Lifestyle Do not use any products that contain nicotine or tobacco. These products include cigarettes, chewing tobacco, and vaping devices, such as e-cigarettes. If you need help quitting, ask your health care provider. Do not use street drugs. Do not share needles. Ask your health care provider for help if you need support or information about quitting drugs.  General instructions Schedule regular health, dental, and eye exams. Stay current with your vaccines. Tell your health care provider if: You often feel depressed. You have ever been abused or do not feel safe at home. Summary Adopting a healthy lifestyle and getting preventive care are important in promoting health and wellness. Follow your health care provider's instructions about healthy diet, exercising, and getting tested or screened for diseases. Follow your health care provider's instructions on monitoring your cholesterol and blood pressure. This information is not intended to replace advice given to you by your health care provider. Make sure you discuss any questions you have with your health care provider. Document Revised: 10/31/2020 Document Reviewed: 10/31/2020 Elsevier  Patient Education  [place].

## 2022-11-12 DIAGNOSIS — Z0100 Encounter for examination of eyes and vision without abnormal findings: Secondary | ICD-10-CM | POA: Diagnosis not present

## 2022-11-30 DIAGNOSIS — L723 Sebaceous cyst: Secondary | ICD-10-CM | POA: Diagnosis not present

## 2022-11-30 DIAGNOSIS — D2261 Melanocytic nevi of right upper limb, including shoulder: Secondary | ICD-10-CM | POA: Diagnosis not present

## 2022-11-30 DIAGNOSIS — L578 Other skin changes due to chronic exposure to nonionizing radiation: Secondary | ICD-10-CM | POA: Diagnosis not present

## 2022-11-30 DIAGNOSIS — D2230 Melanocytic nevi of unspecified part of face: Secondary | ICD-10-CM | POA: Diagnosis not present

## 2022-11-30 DIAGNOSIS — D0362 Melanoma in situ of left upper limb, including shoulder: Secondary | ICD-10-CM | POA: Diagnosis not present

## 2022-11-30 DIAGNOSIS — D224 Melanocytic nevi of scalp and neck: Secondary | ICD-10-CM | POA: Diagnosis not present

## 2022-11-30 DIAGNOSIS — L821 Other seborrheic keratosis: Secondary | ICD-10-CM | POA: Diagnosis not present

## 2022-11-30 DIAGNOSIS — D225 Melanocytic nevi of trunk: Secondary | ICD-10-CM | POA: Diagnosis not present

## 2022-11-30 DIAGNOSIS — D2262 Melanocytic nevi of left upper limb, including shoulder: Secondary | ICD-10-CM | POA: Diagnosis not present

## 2022-12-25 ENCOUNTER — Other Ambulatory Visit: Payer: Self-pay | Admitting: Family Medicine

## 2023-01-06 ENCOUNTER — Other Ambulatory Visit: Payer: Self-pay | Admitting: Family Medicine

## 2023-01-06 DIAGNOSIS — I10 Essential (primary) hypertension: Secondary | ICD-10-CM

## 2023-01-29 ENCOUNTER — Telehealth: Payer: Self-pay | Admitting: *Deleted

## 2023-01-29 NOTE — Telephone Encounter (Signed)
I connected with Patrick Perry on 8/6 at 1009 by telephone and verified that I am speaking with the correct person using two identifiers. According to the patient's chart they are due for follow up with med center [place]. Pt scheduled. There are no transportation issues at this time. Nothing further was needed at the end of our conversation.

## 2023-01-30 ENCOUNTER — Other Ambulatory Visit: Payer: Self-pay | Admitting: Family Medicine

## 2023-01-30 DIAGNOSIS — I10 Essential (primary) hypertension: Secondary | ICD-10-CM

## 2023-03-06 ENCOUNTER — Ambulatory Visit (INDEPENDENT_AMBULATORY_CARE_PROVIDER_SITE_OTHER): Payer: Medicare HMO | Admitting: Family Medicine

## 2023-03-06 ENCOUNTER — Encounter: Payer: Self-pay | Admitting: Family Medicine

## 2023-03-06 VITALS — BP 129/60 | HR 54 | Ht 69.5 in | Wt 152.0 lb

## 2023-03-06 DIAGNOSIS — E7800 Pure hypercholesterolemia, unspecified: Secondary | ICD-10-CM

## 2023-03-06 DIAGNOSIS — D692 Other nonthrombocytopenic purpura: Secondary | ICD-10-CM | POA: Diagnosis not present

## 2023-03-06 DIAGNOSIS — N401 Enlarged prostate with lower urinary tract symptoms: Secondary | ICD-10-CM

## 2023-03-06 DIAGNOSIS — Z23 Encounter for immunization: Secondary | ICD-10-CM

## 2023-03-06 DIAGNOSIS — I10 Essential (primary) hypertension: Secondary | ICD-10-CM | POA: Diagnosis not present

## 2023-03-06 NOTE — Assessment & Plan Note (Signed)
Well controlled. Continue current regimen. Follow up in  6 mo  

## 2023-03-06 NOTE — Assessment & Plan Note (Signed)
Stable No changes 

## 2023-03-06 NOTE — Progress Notes (Signed)
   Established Patient Office Visit  Subjective   Patient ID: Patrick Perry, male    DOB: [birth year]  Age: 89 y.o. MRN: [id number]  Chief Complaint  Patient presents with   Hypertension    HPI Hypertension- Pt denies chest pain, SOB, dizziness, or heart palpitations.  Taking meds as directed w/o problems.  Denies medication side effects.    F/U BPH - no change   Hyperlipidemia - tolerating stating well with no myalgias or significant side effects.  Lab Results  Component Value Date   CHOL 157 06/01/2021   HDL 60 06/01/2021   LDLCALC 78 06/01/2021   TRIG 111 06/01/2021   CHOLHDL 2.6 06/01/2021   He just saw dermatology in June Dr. Honor Loh.  He has a history of melanoma.    ROS    Objective:     BP 129/60   Pulse (!) 54   Ht 5' 9.5" (1.765 m)   Wt 152 lb (68.9 kg)   SpO2 99%   BMI 22.12 kg/m    Physical Exam Vitals and nursing note reviewed.  Constitutional:      Appearance: Normal appearance.  HENT:     Head: Normocephalic and atraumatic.  Eyes:     Conjunctiva/sclera: Conjunctivae normal.  Neck:     Vascular: No carotid bruit.  Cardiovascular:     Rate and Rhythm: Normal rate and regular rhythm.  Pulmonary:     Effort: Pulmonary effort is normal.     Breath sounds: Normal breath sounds.  Skin:    General: Skin is warm and dry.  Neurological:     Mental Status: He is alert.  Psychiatric:        Mood and Affect: Mood normal.      No results found for any visits on 03/06/23.    The ASCVD Risk score (Arnett DK, et al., 2019) failed to calculate for the following reasons:   The 2019 ASCVD risk score is only valid for ages 75 to 3    Assessment & Plan:   Problem List Items Addressed This Visit       Cardiovascular and Mediastinum   Purpura senilis (HCC)    Stable. No changes       Relevant Orders   CMP14+EGFR   Lipid panel   CBC with Differential/Platelet   Essential hypertension, benign - Primary    Well controlled. Continue  current regimen. Follow up in  7mo       Relevant Orders   CMP14+EGFR   Lipid panel   CBC with Differential/Platelet     Genitourinary   BPH (benign prostatic hyperplasia)   Relevant Orders   CMP14+EGFR   Lipid panel   CBC with Differential/Platelet     Other   Hyperlipidemia    Due to recheck lipids. Tolerating statin well.       Relevant Orders   CMP14+EGFR   Lipid panel   CBC with Differential/Platelet   Other Visit Diagnoses     Encounter for immunization       Relevant Orders   Flu Vaccine Trivalent High Dose (Fluad) (Completed)      Discussed need for COVID vaccine. He will think aboutt it.   Return in about 6 months (around 09/10/2023) for Hypertension.    Nani Gasser, MD

## 2023-03-06 NOTE — Assessment & Plan Note (Signed)
Due to recheck lipids.  Tolerating statin well. 

## 2023-03-07 LAB — LIPID PANEL
Chol/HDL Ratio: 2.9 ratio (ref 0.0–5.0)
Cholesterol, Total: 163 mg/dL (ref 100–199)
HDL: 56 mg/dL (ref >39)
LDL Chol Calc (NIH): 93 mg/dL (ref 0–99)
Triglycerides: 71 mg/dL (ref 0–149)
VLDL Cholesterol Cal: 14 mg/dL (ref 5–40)

## 2023-03-07 LAB — CMP14+EGFR
ALT: 7 IU/L (ref 0–44)
AST: 23 IU/L (ref 0–40)
Albumin: 4.2 g/dL (ref 3.7–4.7)
Alkaline Phosphatase: 71 IU/L (ref 44–121)
BUN/Creatinine Ratio: 17 (ref 10–24)
BUN: 18 mg/dL (ref 8–27)
Bilirubin Total: 0.4 mg/dL (ref 0.0–1.2)
CO2: 26 mmol/L (ref 20–29)
Calcium: 9.4 mg/dL (ref 8.6–10.2)
Chloride: 103 mmol/L (ref 96–106)
Creatinine, Ser: 1.06 mg/dL (ref 0.76–1.27)
Globulin, Total: 3.2 g/dL (ref 1.5–4.5)
Glucose: 82 mg/dL (ref 70–99)
Potassium: 4.1 mmol/L (ref 3.5–5.2)
Sodium: 140 mmol/L (ref 134–144)
Total Protein: 7.4 g/dL (ref 6.0–8.5)
eGFR: 68 mL/min/{1.73_m2} (ref >59)

## 2023-03-07 LAB — CBC WITH DIFFERENTIAL/PLATELET
Basophils Absolute: 0.1 10*3/uL (ref 0.0–0.2)
Basos: 1 %
EOS (ABSOLUTE): 0.2 10*3/uL (ref 0.0–0.4)
Eos: 3 %
Hematocrit: 44.1 % (ref 37.5–51.0)
Hemoglobin: 14.4 g/dL (ref 13.0–17.7)
Immature Grans (Abs): 0 10*3/uL (ref 0.0–0.1)
Immature Granulocytes: 0 %
Lymphocytes Absolute: 1.7 10*3/uL (ref 0.7–3.1)
Lymphs: 28 %
MCH: 31.6 pg (ref 26.6–33.0)
MCHC: 32.7 g/dL (ref 31.5–35.7)
MCV: 97 fL (ref 79–97)
Monocytes Absolute: 0.8 10*3/uL (ref 0.1–0.9)
Monocytes: 13 %
Neutrophils Absolute: 3.4 10*3/uL (ref 1.4–7.0)
Neutrophils: 55 %
Platelets: 234 10*3/uL (ref 150–450)
RBC: 4.55 x10E6/uL (ref 4.14–5.80)
RDW: 13.4 % (ref 11.6–15.4)
WBC: 6.1 10*3/uL (ref 3.4–10.8)

## 2023-03-07 NOTE — Progress Notes (Signed)
Your lab work is within acceptable range and there are no concerning findings.   ?

## 2023-07-11 DIAGNOSIS — D2262 Melanocytic nevi of left upper limb, including shoulder: Secondary | ICD-10-CM | POA: Diagnosis not present

## 2023-07-11 DIAGNOSIS — Z86006 Personal history of melanoma in-situ: Secondary | ICD-10-CM | POA: Diagnosis not present

## 2023-07-11 DIAGNOSIS — L720 Epidermal cyst: Secondary | ICD-10-CM | POA: Diagnosis not present

## 2023-07-11 DIAGNOSIS — L821 Other seborrheic keratosis: Secondary | ICD-10-CM | POA: Diagnosis not present

## 2023-07-11 DIAGNOSIS — D2261 Melanocytic nevi of right upper limb, including shoulder: Secondary | ICD-10-CM | POA: Diagnosis not present

## 2023-07-11 DIAGNOSIS — L578 Other skin changes due to chronic exposure to nonionizing radiation: Secondary | ICD-10-CM | POA: Diagnosis not present

## 2023-07-11 DIAGNOSIS — L570 Actinic keratosis: Secondary | ICD-10-CM | POA: Diagnosis not present

## 2023-07-11 DIAGNOSIS — D2230 Melanocytic nevi of unspecified part of face: Secondary | ICD-10-CM | POA: Diagnosis not present

## 2023-07-11 DIAGNOSIS — D224 Melanocytic nevi of scalp and neck: Secondary | ICD-10-CM | POA: Diagnosis not present

## 2023-07-11 DIAGNOSIS — D225 Melanocytic nevi of trunk: Secondary | ICD-10-CM | POA: Diagnosis not present

## 2023-08-03 ENCOUNTER — Other Ambulatory Visit: Payer: Self-pay | Admitting: Family Medicine

## 2023-08-03 DIAGNOSIS — I10 Essential (primary) hypertension: Secondary | ICD-10-CM

## 2023-08-12 ENCOUNTER — Ambulatory Visit (INDEPENDENT_AMBULATORY_CARE_PROVIDER_SITE_OTHER): Payer: Medicare HMO

## 2023-08-12 VITALS — Ht 69.0 in | Wt 156.0 lb

## 2023-08-12 DIAGNOSIS — Z0000 Encounter for general adult medical examination without abnormal findings: Secondary | ICD-10-CM

## 2023-08-12 NOTE — Addendum Note (Signed)
 Addended by: Chalmers Cater on: 08/12/2023 01:38 PM   Modules accepted: Level of Service

## 2023-08-12 NOTE — Progress Notes (Addendum)
 Subjective:   Patrick Perry is a 89 y.o. male who presents for Medicare Annual/Subsequent preventive examination.  Visit Complete: Virtual I connected with  Matilde Sprang on 08/12/23 by a audio enabled telemedicine application and verified that I am speaking with the correct person using two identifiers.  Patient Location: Home  Provider Location: Office/Clinic  I discussed the limitations of evaluation and management by telemedicine. The patient expressed understanding and agreed to proceed.  Vital Signs: Because this visit was a virtual/telehealth visit, some criteria may be missing or patient reported. Any vitals not documented were not able to be obtained and vitals that have been documented are patient reported.  Patient Medicare AWV questionnaire was completed by the patient on 08/07/2023; I have confirmed that all information answered by patient is correct and no changes since this date.  Cardiac Risk Factors include: male gender;family history of premature cardiovascular disease;hypertension;dyslipidemia;advanced age (>69men, >22 women)     Objective:    Today's Vitals   08/12/23 1018  Weight: 156 lb (70.8 kg)  Height: 5\' 9"  (1.753 m)   Body mass index is 23.04 kg/m.     08/12/2023   10:31 AM 08/06/2022    9:00 AM 08/04/2021    2:52 PM 06/15/2020    9:03 AM 06/15/2019    9:09 AM 06/11/2018    9:04 AM 08/10/2017   10:28 AM  Advanced Directives  Does Patient Have a Medical Advance Directive? Yes Yes Yes Yes Yes Yes No  Type of Estate agent of [place];Living will Living will;Healthcare Power of Attorney Living will;Healthcare Power of [employer] Power of [place];Living will Healthcare Power of [place];Living will Healthcare Power of [place];Living will   Does patient want to make changes to medical advance directive?  No - Patient declined  No - Patient declined No - Patient declined No - Patient declined   Copy of Healthcare Power  of Attorney in Chart? Yes - validated most recent copy scanned in chart (See row information) Yes - validated most recent copy scanned in chart (See row information) Yes - validated most recent copy scanned in chart (See row information) Yes - validated most recent copy scanned in chart (See row information) No - copy requested No - copy requested   Would patient like information on creating a medical advance directive?    No - Patient declined       Current Medications (verified) Outpatient Encounter Medications as of 08/12/2023  Medication Sig   Ascorbic Acid 100 MG CHEW 1 tablet Orally Once a day for 30 day(s)   b complex vitamins capsule Take 1 capsule by mouth daily.   Cholecalciferol (VITAMIN D-3) 1000 UNITS CAPS Take by mouth 3 (three) times daily.    Coenzyme Q10 (COQ10 PO) Take 5 mg by mouth daily.   GLUCOSAMINE-CHONDROITIN-CA-D3 PO Take 2,000 mg by mouth.   hydrochlorothiazide (HYDRODIURIL) 25 MG tablet TAKE 0.5 TABLETS (12.5 MG TOTAL) BY MOUTH DAILY. NEEDS APPOINTMENT.   lovastatin (MEVACOR) 40 MG tablet TAKE 1 TABLET BY MOUTH EVERYDAY AT BEDTIME   [DISCONTINUED] hydrochlorothiazide (HYDRODIURIL) 25 MG tablet TAKE 1/2 TABLET BY MOUTH EVERY DAY   [DISCONTINUED] lovastatin (MEVACOR) 40 MG tablet TAKE 1 TABLET BY MOUTH EVERYDAY AT BEDTIME   No facility-administered encounter medications on file as of 08/12/2023.    Allergies (verified) Ampicillin and Clindamycin/lincomycin   History: Past Medical History:  Diagnosis Date   Cataract    H/O colonoscopy with polypectomy 06/12/11   repeat in 5 years  History of tonsillectomy 1946   Past Surgical History:  Procedure Laterality Date   BUNIONECTOMY  10/07/2012   Hammer toe   CATARACT EXTRACTION, BILATERAL  2009   HEMORRHOID SURGERY     REPAIR KNEE LIGAMENT  1978   ROTATOR CUFF REPAIR  2008   left   TONSILLECTOMY  1946   Family History  Problem Relation Age of Onset   Prostate cancer Brother    Cancer Sister    Multiple  sclerosis Sister    Social History   Socioeconomic History   Marital status: Married    Spouse name: Dewayne Hatch   Number of children: 2   Years of education: BA   Highest education level: Bachelor's degree (e.g., BA, AB, BS)  Occupational History   Occupation: Retired.    Comment: engineering  Tobacco Use   Smoking status: Never   Smokeless tobacco: Never  Vaping Use   Vaping status: Never Used  Substance and Sexual Activity   Alcohol use: No   Drug use: No   Sexual activity: Yes  Other Topics Concern   Not on file  Social History Narrative   Lives with his wife. He has two children. He enjoys wood working and Presenter, broadcasting.   Social Drivers of Corporate investment banker Strain: Low Risk  (08/12/2023)   Overall Financial Resource Strain (CARDIA)    Difficulty of Paying Living Expenses: Not very hard  Food Insecurity: No Food Insecurity (08/12/2023)   Hunger Vital Sign    Worried About Running Out of Food in the Last Year: Never true    Ran Out of Food in the Last Year: Never true  Transportation Needs: No Transportation Needs (08/12/2023)   PRAPARE - Administrator, Civil Service (Medical): No    Lack of Transportation (Non-Medical): No  Physical Activity: Sufficiently Active (08/12/2023)   Exercise Vital Sign    Days of Exercise per Week: 5 days    Minutes of Exercise per Session: 40 min  Stress: No Stress Concern Present (08/12/2023)   [employer] of Occupational Health - Occupational Stress Questionnaire    Feeling of Stress : Not at all  Social Connections: Socially Integrated (08/12/2023)   Social Connection and Isolation Panel [NHANES]    Frequency of Communication with Friends and Family: More than three times a week    Frequency of Social Gatherings with Friends and Family: Twice a week    Attends Religious Services: More than 4 times per year    Active Member of [employer] or Organizations: Yes    Attends Engineer, structural: More than 4 times per  year    Marital Status: Married    Tobacco Counseling Counseling given: Not Answered   Clinical Intake:  Pre-visit preparation completed: Yes  Pain : No/denies pain     BMI - recorded: 23.04 Nutritional Status: BMI of 19-24  Normal Nutritional Risks: None Diabetes: No  How often do you need to have someone help you when you read instructions, pamphlets, or other written materials from your doctor or pharmacy?: 1 - Never What is the last grade level you completed in school?: 16  Interpreter Needed?: No      Activities of Daily Living    08/12/2023   10:21 AM  In your present state of health, do you have any difficulty performing the following activities:  Hearing? 1  Comment hearing aids  Vision? 0  Difficulty concentrating or making decisions? 0  Walking or climbing stairs? 0  Dressing or bathing? 0  Doing errands, shopping? 0  Preparing Food and eating ? N  Using the Toilet? N  In the past six months, have you accidently leaked urine? N  Do you have problems with loss of bowel control? N  Managing your Medications? N  Managing your Finances? N  Housekeeping or managing your Housekeeping? N    Patient Care Team: Agapito Games, MD as PCP - General (Family Medicine) Tanya Nones, OD (Optometry) Elmon Else, MD as Attending Physician (Dermatology)  Indicate any recent Medical Services you may have received from other than [facility] providers in the past year (date may be approximate).     Assessment:   This is a routine wellness examination for Haim.  Hearing/Vision screen Hearing Screening - Comments:: Unable to test Vision Screening - Comments:: Unable to test   Goals Addressed             This Visit's Progress    Exercise 3x per week (30 min per time)       He does want to exercise at least 3 - 5 times a week.       Depression Screen    08/12/2023   10:33 AM 08/12/2023   10:30 AM 03/06/2023    8:57 AM 08/06/2022    9:00 AM 11/30/2021     9:03 AM 08/04/2021    2:53 PM 06/01/2021   11:04 AM  PHQ 2/9 Scores  PHQ - 2 Score 0 0 0 0 0 0 0    Fall Risk    08/12/2023   10:32 AM 03/06/2023    8:57 AM 08/06/2022    9:00 AM 11/30/2021    9:03 AM 08/04/2021    2:52 PM  Fall Risk   Falls in the past year? 0 1 0 0 1  Number falls in past yr: 0 0 0 0 0  Injury with Fall? 0 0 0 0 1  Risk for fall due to : No Fall Risks No Fall Risks No Fall Risks No Fall Risks History of fall(s);Other (Comment)  Risk for fall due to: Comment     slipped on ICE  Follow up Falls evaluation completed Falls evaluation completed Falls evaluation completed Falls prevention discussed;Falls evaluation completed Falls evaluation completed;Education provided;Falls prevention discussed    MEDICARE RISK AT HOME: Medicare Risk at Home Any stairs in or around the home?: Yes If so, are there any without handrails?: Yes Home free of loose throw rugs in walkways, pet beds, electrical cords, etc?: Yes Adequate lighting in your home to reduce risk of falls?: Yes Life alert?: No Use of a cane, walker or w/c?: No Grab bars in the bathroom?: Yes Shower chair or bench in shower?: Yes Elevated toilet seat or a handicapped toilet?: Yes  TIMED UP AND GO:  Was the test performed?  No    Cognitive Function:        08/12/2023   10:33 AM 08/06/2022    9:06 AM 08/04/2021    3:04 PM 06/15/2020    9:07 AM 06/15/2019    9:16 AM  6CIT Screen  What Year? 0 points 0 points 0 points 0 points 0 points  What month?  0 points 0 points 0 points 0 points  What time? 0 points 0 points 0 points 0 points 0 points  Count back from 20 0 points 0 points 0 points 0 points 0 points  Months in reverse 0 points 0 points 0 points 0 points 0 points  Repeat phrase 0 points 0 points 0 points 0 points 0 points  Total Score  0 points 0 points 0 points 0 points    Immunizations Immunization History  Administered Date(s) Administered   Fluad Quad(high Dose 65+) 04/26/2020, 03/19/2022    Fluad Trivalent(High Dose 65+) 03/06/2023   Influenza Split 03/19/2012   Influenza, High Dose Seasonal PF 04/21/2013, 03/26/2017, 03/31/2018, 04/29/2019, 04/24/2021   Influenza,inj,Quad PF,6+ Mos 04/07/2015   Influenza-Unspecified 04/02/2016   PFIZER Comirnaty(Gray Top)Covid-19 Tri-Sucrose Vaccine 03/23/2022   PFIZER(Purple Top)SARS-COV-2 Vaccination 07/14/2019, 08/04/2019, 04/26/2020   PNEUMOCOCCAL CONJUGATE-20 11/30/2021   Pneumococcal Conjugate-13 05/25/2014   Pneumococcal Polysaccharide-23 04/30/2006   Tdap 05/01/2010, 06/02/2020   Zoster Recombinant(Shingrix) 04/29/2019, 06/29/2019   Zoster, Live 04/03/2006    TDAP status: Up to date  Flu Vaccine status: Up to date  Pneumococcal vaccine status: Up to date  Covid-19 vaccine status: Completed vaccines  Qualifies for Shingles Vaccine? Yes   Zostavax completed Yes   Shingrix Completed?: Yes  Screening Tests Health Maintenance  Topic Date Due   COVID-19 Vaccine (5 - 2024-25 season) 02/24/2023   Medicare Annual Wellness (AWV)  08/11/2024   DTaP/Tdap/Td (3 - Td or Tdap) 06/02/2030   Pneumonia Vaccine 67+ Years old  Completed   INFLUENZA VACCINE  Completed   Zoster Vaccines- Shingrix  Completed   HPV VACCINES  Aged Out    Health Maintenance  Health Maintenance Due  Topic Date Due   COVID-19 Vaccine (5 - 2024-25 season) 02/24/2023    Colorectal cancer screening: No longer required.   Lung Cancer Screening: (Low Dose CT Chest recommended if Age 45-80 years, 20 pack-year currently smoking OR have quit w/in 15years.) does not qualify.   Lung Cancer Screening Referral: n/a  Additional Screening:  Hepatitis C Screening: does not qualify; Completed n/a  Vision Screening: Recommended annual ophthalmology exams for early detection of glaucoma and other disorders of the eye. Is the patient up to date with their annual eye exam?  Yes  Who is the provider or what is the name of the office in which the patient attends annual  eye exams? Dr Domingo Mend If pt is not established with a provider, would they like to be referred to a provider to establish care? N/a  Dental Screening: Recommended annual dental exams for proper oral hygiene    Community Resource Referral / Chronic Care Management: CRR required this visit?  No   CCM required this visit?  No     Plan:     I have personally reviewed and noted the following in the patient's chart:   Medical and social history Use of alcohol, tobacco or illicit drugs  Current medications and supplements including opioid prescriptions. Patient is not currently taking opioid prescriptions. Functional ability and status Nutritional status Physical activity Advanced directives List of other physicians Hospitalizations, surgeries, and ER visits in previous 12 months Vitals Screenings to include cognitive, depression, and falls Referrals and appointments  In addition, I have reviewed and discussed with patient certain preventive protocols, quality metrics, and best practice recommendations. A written personalized care plan for preventive services as well as general preventive health recommendations were provided to patient.     Esmond Harps, CMA   08/12/2023   After Visit Summary: (Mail) Due to this being a telephonic visit, the after visit summary with patients personalized plan was offered to patient via mail   Nurse Notes:    HORTON ELLITHORPE is a 89 y.o. male patient of Metheney, Barbarann Ehlers, MD who  had a [employer] Visit today via telephone. Dawn is Retired and lives with their spouse. He has 2 children, sons. He reports that he is socially active and does interact with friends/family regularly. he is moderately physically active and enjoys  He enjoys wood working and Presenter, broadcasting.Marland Kitchen

## 2023-08-12 NOTE — Patient Instructions (Signed)
  Mr. Lunz , Thank you for taking time to come for your Medicare Wellness Visit. I appreciate your ongoing commitment to your health goals. Please review the following plan we discussed and let me know if I can assist you in the future.   These are the goals we discussed:  Goals       Exercise 3x per week (30 min per time)      Exercise more than three times a week to help keep his muscles strong in his legs. Gym has silver sneakers but he has a gym at home.      Exercise 3x per week (30 min per time)      He does want to exercise at least 3 - 5 times a week.       Patient Stated      Patient states maintain his exercise regiman and maintain his weight.      Patient Stated (pt-stated)      Patient that he would like to continue to have a regular routine on his treadmill and his total gym.      Patient Stated (pt-stated)      Patient stated that he would like to maintain his exercise routine.        This is a list of the screening recommended for you and due dates:  Health Maintenance  Topic Date Due   COVID-19 Vaccine (5 - 2024-25 season) 02/24/2023   Medicare Annual Wellness Visit  08/11/2024   DTaP/Tdap/Td vaccine (3 - Td or Tdap) 06/02/2030   Pneumonia Vaccine  Completed   Flu Shot  Completed   Zoster (Shingles) Vaccine  Completed   HPV Vaccine  Aged Out

## 2023-09-10 ENCOUNTER — Encounter: Payer: Self-pay | Admitting: Family Medicine

## 2023-09-10 ENCOUNTER — Ambulatory Visit (INDEPENDENT_AMBULATORY_CARE_PROVIDER_SITE_OTHER): Payer: Medicare HMO | Admitting: Family Medicine

## 2023-09-10 VITALS — BP 128/66 | HR 51 | Temp 97.5°F | Ht 69.0 in | Wt 157.0 lb

## 2023-09-10 DIAGNOSIS — I10 Essential (primary) hypertension: Secondary | ICD-10-CM

## 2023-09-10 DIAGNOSIS — J019 Acute sinusitis, unspecified: Secondary | ICD-10-CM | POA: Diagnosis not present

## 2023-09-10 MED ORDER — DOXYCYCLINE HYCLATE 100 MG PO TABS
100.0000 mg | ORAL_TABLET | Freq: Two times a day (BID) | ORAL | 0 refills | Status: DC
Start: 2023-09-10 — End: 2023-10-31

## 2023-09-10 NOTE — Progress Notes (Signed)
 Established Patient Office Visit  Subjective  Patient ID: Patrick Perry, male    DOB: Mar 16, 1933  Age: 89 y.o. MRN: [id number]  Chief Complaint  Patient presents with   Hypertension    HPI   32-month follow-up for hypertension.  He reports he is doing well on his medication no recent chest pain shortness of breath dizziness or lightheadedness.  He is taking his medications regularly.  Is a little over a week ago he started getting a clear runny nose and a lot of sinus drainage and sneezing.  After several days of symptoms he had 1 night where he actually vomited 3 times.  No diarrhea the next day he felt a little bit better but since then he is continue to have a lot of drainage he did have a sore throat initially but that has now resolved he says the drainage is now getting more discolored and he feels like it is draining into his chest.  He has not noticed any shortness of breath or wheezing.  No significant ear pain or popping.  More nasal congestion than he had initially.  Feels like his appetite has been good he has been able to maintain his weight.  He says he just tries not to overeat.     ROS    Objective:     BP 128/66   Pulse (!) 51   Temp (!) 97.5 F (36.4 C) (Oral)   Ht 5\' 9"  (1.753 m)   Wt 157 lb (71.2 kg)   SpO2 100%   BMI 23.18 kg/m    Physical Exam Constitutional:      Appearance: Normal appearance.  HENT:     Head: Normocephalic and atraumatic.     Right Ear: Tympanic membrane, ear canal and external ear normal. There is no impacted cerumen.     Left Ear: Ear canal and external ear normal. There is no impacted cerumen.     Ears:     Comments: Left TM blocked by cerumen    Nose: Nose normal.     Mouth/Throat:     Pharynx: Oropharynx is clear.  Eyes:     Conjunctiva/sclera: Conjunctivae normal.  Cardiovascular:     Rate and Rhythm: Normal rate and regular rhythm.  Pulmonary:     Effort: Pulmonary effort is normal.     Breath sounds: Normal  breath sounds.  Musculoskeletal:     Cervical back: Neck supple. No tenderness.  Lymphadenopathy:     Cervical: No cervical adenopathy.  Skin:    General: Skin is warm and dry.  Neurological:     Mental Status: He is alert and oriented to person, place, and time.  Psychiatric:        Mood and Affect: Mood normal.      No results found for any visits on 09/10/23.    The ASCVD Risk score (Arnett DK, et al., 2019) failed to calculate for the following reasons:   The 2019 ASCVD risk score is only valid for ages 54 to 83    Assessment & Plan:   Problem List Items Addressed This Visit       Cardiovascular and Mediastinum   Essential hypertension, benign - Primary   Blood pressure at goal.  Due for updated labs today.  Follow-up in 6 months.      Relevant Orders   CMP14+EGFR   Other Visit Diagnoses       Acute non-recurrent sinusitis, unspecified location       Relevant Medications  doxycycline (VIBRA-TABS) 100 MG tablet      Acute sinusitis-we did discuss that typically we recommend giving it 10 days before treating with an antibiotic he is probably around day 9.  So I did go ahead and send over a prescription encouraged him to give it another day or 2 just to see if it improves before taking the antibiotic and he is agreeable to treatment plan.  Return in about 6 months (around 03/12/2024) for Hypertension.    Nani Gasser, MD

## 2023-09-10 NOTE — Assessment & Plan Note (Signed)
 Blood pressure at goal.  Due for updated labs today.  Follow-up in 6 months.

## 2023-09-11 ENCOUNTER — Encounter: Payer: Self-pay | Admitting: Family Medicine

## 2023-09-11 LAB — CMP14+EGFR
ALT: 12 IU/L (ref 0–44)
AST: 33 IU/L (ref 0–40)
Albumin: 4 g/dL (ref 3.7–4.7)
Alkaline Phosphatase: 65 IU/L (ref 44–121)
BUN/Creatinine Ratio: 19 (ref 10–24)
BUN: 20 mg/dL (ref 8–27)
Bilirubin Total: 0.4 mg/dL (ref 0.0–1.2)
CO2: 25 mmol/L (ref 20–29)
Calcium: 8.9 mg/dL (ref 8.6–10.2)
Chloride: 103 mmol/L (ref 96–106)
Creatinine, Ser: 1.04 mg/dL (ref 0.76–1.27)
Globulin, Total: 3.1 g/dL (ref 1.5–4.5)
Glucose: 82 mg/dL (ref 70–99)
Potassium: 4.1 mmol/L (ref 3.5–5.2)
Sodium: 141 mmol/L (ref 134–144)
Total Protein: 7.1 g/dL (ref 6.0–8.5)
eGFR: 69 mL/min/{1.73_m2} (ref >59)

## 2023-09-11 NOTE — Progress Notes (Signed)
 Your lab work is within acceptable range and there are no concerning findings.   ?

## 2023-10-31 ENCOUNTER — Ambulatory Visit: Payer: Self-pay

## 2023-10-31 ENCOUNTER — Ambulatory Visit (INDEPENDENT_AMBULATORY_CARE_PROVIDER_SITE_OTHER): Admitting: Family Medicine

## 2023-10-31 ENCOUNTER — Encounter: Payer: Self-pay | Admitting: Family Medicine

## 2023-10-31 VITALS — BP 121/65 | HR 63 | Ht 69.5 in | Wt 155.0 lb

## 2023-10-31 DIAGNOSIS — W57XXXA Bitten or stung by nonvenomous insect and other nonvenomous arthropods, initial encounter: Secondary | ICD-10-CM

## 2023-10-31 DIAGNOSIS — S70361A Insect bite (nonvenomous), right thigh, initial encounter: Secondary | ICD-10-CM

## 2023-10-31 MED ORDER — DOXYCYCLINE HYCLATE 100 MG PO TABS: 100.0000 mg | ORAL_TABLET | Freq: Two times a day (BID) | ORAL | 0 refills | Status: DC

## 2023-10-31 NOTE — Progress Notes (Signed)
   Acute Office Visit  Subjective:     Patient ID: Patrick Perry, male    DOB: 1933/02/14, 89 y.o.   MRN: [id number]  Chief Complaint  Patient presents with   Tick Removal    Pt was bitten on Tuesday he reports that he removed it and used Benadryl cream on the site. His wife stated that yesterday she noticed some pus coming from the wound    HPI Patient is in today for tick bite he found the tick on Tuesday and then this morning when they took the Band-Aid off they noticed a little bit of drainage.  There is also some redness right around the area that is approximately 2 to 3 cm from the edge of the actual tick bite he says when he removed it it really look like it was embedded in the skin he actually did bring the tick with him it is fairly small.  No fever or chills he feels like he got it off pretty early.  He does not think it was there for a long period of time.  No rashes elsewhere.  ROS      Objective:    BP 121/65   Pulse 63   Ht 5' 9.5" (1.765 m)   Wt 155 lb (70.3 kg)   SpO2 96%   BMI 22.56 kg/m    Physical Exam Skin:    Comments: His right inner thigh he has an area that almost looks a little bruised with a serous blister on top of the skin and some mild erythema that fades from the border approximately 2 to 3 cm out from the edge of the tick bite.     No results found for any visits on 10/31/23.      Assessment & Plan:   Problem List Items Addressed This Visit   None Visit Diagnoses       Tick bite of right thigh, initial encounter    -  Primary      Does look like he has just some inflammation from the tick bite and a little vesicle there that is leaking some clear fluid as well as a little spreading erythema some going to go ahead and treat with doxycycline  to cover for localized skin infection.  No current evidence of Lyme's disease or recommended spotted fever but I did show him some pictures to look out for any new rashes and to contact us   immediately if he develops a fever.  Call if not better and 1 to 2 weeks.  Keep covered while it is draining.  Meds ordered this encounter  Medications   doxycycline  (VIBRA -TABS) 100 MG tablet    Sig: Take 1 tablet (100 mg total) by mouth 2 (two) times daily.    Dispense:  14 tablet    Refill:  0    No follow-ups on file.  Duaine German, MD

## 2023-10-31 NOTE — Telephone Encounter (Signed)
  Chief Complaint: Tick bite Symptoms: redness, oozing pus Frequency: since Tuesday Pertinent Negatives: Patient denies fever Disposition: [] ED /[] Urgent Care (no appt availability in office) / [x] Appointment(In office/virtual)/ []  [facility]/ [] Home Care/ [] Refused Recommended Disposition /[] [facility]/ []  Follow-up with PCP Additional Notes: Patient and wife called in stating patient noticed a tick on his right, upper thigh on Tuesday. The tick was small, black, and embedded into the skin. Patient has removed the tick but now has oozing and redness at the site. Patient denies fever. Patient appt made for further evaluation.    Copied from CRM [id number]. Topic: Clinical - Red Word Triage >> Oct 31, 2023  8:08 AM Blair Bumpers wrote: Red Word that prompted transfer to Nurse Triage: Patient's wife, Rexene Catching, states patient has a tick bite that they removed on 10/29/23. She states the tick was embedded into the skin. They saved it so that they can identify it. The skin is broken & oozing. Wound is open and about the size of the head of a large straight pin & is redness around the wound. Has a band-aid over it. No fever. Wants to be seen today if possible. Reason for Disposition  [1] Red or very tender (to touch) area AND [2] started over 24 hours after the bite    Site also has pus  Answer Assessment - Initial Assessment Questions 1. ATTACHED:  "Is the tick still on the skin?"  (e.g., yes, no, unsure)     No 2. ONSET - TICK STILL ATTACHED:  "How long do you think the tick has been on your skin?" (e.g., hours, days, unsure)  Note:  Is there a recent activity (camping, hiking) where the caller may have been exposed?     Not attached 3. ONSET - TICK NOT STILL ATTACHED: "If the tick has been removed, how long do you think the tick was attached before you removed it?" (e.g., 5 hours, 2 days). "When was this?"     Believe it was attached overnight 4. LOCATION: "Where is the tick bite  located?" (e.g., arm, leg)     Upper part of right leg - front of thigh 5. TYPE of TICK: "Is it a wood tick or a deer tick?" (e.g., deer tick, wood tick; unsure)     Unsure of which kind  6. SIZE of TICK: "How big is the tick?" (e.g., size of poppy seed, apple seed, watermelon seed; unsure) Note: Deer ticks can be the size of a poppy seed (nymph) or an apple seed (adult).       "Very tiny and black, more in a ball" 7. ENGORGED: "Did the tick look flat or engorged (full, swollen)?" (e.g., flat, engorged; unsure)     Patient unsure 8. OTHER SYMPTOMS: "Do you have any other symptoms?" (e.g., fever, rash, redness at bite area, red ring around bite)     Oozing fluid, redness  Protocols used: Tick Bite-A-AH

## 2023-10-31 NOTE — Telephone Encounter (Signed)
 FYI - Patient is scheduled to see the provider today.

## 2023-11-14 DIAGNOSIS — H524 Presbyopia: Secondary | ICD-10-CM | POA: Diagnosis not present

## 2023-12-20 DIAGNOSIS — D2372 Other benign neoplasm of skin of left lower limb, including hip: Secondary | ICD-10-CM | POA: Diagnosis not present

## 2023-12-20 DIAGNOSIS — S70361A Insect bite (nonvenomous), right thigh, initial encounter: Secondary | ICD-10-CM | POA: Diagnosis not present

## 2023-12-20 DIAGNOSIS — D2230 Melanocytic nevi of unspecified part of face: Secondary | ICD-10-CM | POA: Diagnosis not present

## 2023-12-20 DIAGNOSIS — W57XXXA Bitten or stung by nonvenomous insect and other nonvenomous arthropods, initial encounter: Secondary | ICD-10-CM | POA: Diagnosis not present

## 2023-12-20 DIAGNOSIS — L720 Epidermal cyst: Secondary | ICD-10-CM | POA: Diagnosis not present

## 2023-12-20 DIAGNOSIS — D2272 Melanocytic nevi of left lower limb, including hip: Secondary | ICD-10-CM | POA: Diagnosis not present

## 2023-12-20 DIAGNOSIS — L570 Actinic keratosis: Secondary | ICD-10-CM | POA: Diagnosis not present

## 2023-12-20 DIAGNOSIS — D485 Neoplasm of uncertain behavior of skin: Secondary | ICD-10-CM | POA: Diagnosis not present

## 2023-12-20 DIAGNOSIS — D2262 Melanocytic nevi of left upper limb, including shoulder: Secondary | ICD-10-CM | POA: Diagnosis not present

## 2023-12-20 DIAGNOSIS — L578 Other skin changes due to chronic exposure to nonionizing radiation: Secondary | ICD-10-CM | POA: Diagnosis not present

## 2023-12-20 DIAGNOSIS — D2261 Melanocytic nevi of right upper limb, including shoulder: Secondary | ICD-10-CM | POA: Diagnosis not present

## 2023-12-20 DIAGNOSIS — D225 Melanocytic nevi of trunk: Secondary | ICD-10-CM | POA: Diagnosis not present

## 2024-01-10 ENCOUNTER — Other Ambulatory Visit: Payer: Self-pay | Admitting: Family Medicine

## 2024-02-05 ENCOUNTER — Other Ambulatory Visit: Payer: Self-pay | Admitting: Family Medicine

## 2024-02-05 DIAGNOSIS — I10 Essential (primary) hypertension: Secondary | ICD-10-CM

## 2024-03-12 ENCOUNTER — Ambulatory Visit (INDEPENDENT_AMBULATORY_CARE_PROVIDER_SITE_OTHER): Admitting: Family Medicine

## 2024-03-12 ENCOUNTER — Encounter: Payer: Self-pay | Admitting: Family Medicine

## 2024-03-12 VITALS — BP 129/58 | HR 53 | Temp 97.7°F | Ht 69.0 in | Wt 152.2 lb

## 2024-03-12 DIAGNOSIS — E7800 Pure hypercholesterolemia, unspecified: Secondary | ICD-10-CM | POA: Diagnosis not present

## 2024-03-12 DIAGNOSIS — I10 Essential (primary) hypertension: Secondary | ICD-10-CM | POA: Diagnosis not present

## 2024-03-12 NOTE — Progress Notes (Signed)
 Established Patient Office Visit  Subjective  Patient ID: Patrick Perry, male    DOB: 1933-02-14  Age: 89 y.o. MRN: [id number]  Chief Complaint  Patient presents with   Follow-up    6 month - hypertension     HPI  6 mo f/u for HTN    Discussed the use of AI scribe software for clinical note transcription with the patient, who gave verbal consent to proceed.  History of Present Illness Patrick Perry is an 89 year old male with hypertension who presents for a routine follow-up visit.  Hypertension and antihypertensive therapy - Blood pressure monitored at home with systolic values ranging from 120 to 132 mmHg and diastolic values in the fifties - Uses home blood pressure monitor occasionally, especially when feeling unwell - Currently taking antihypertensive medications - Feels well on current regimen but sometimes questions the necessity of continued medication  Ophthalmologic status - No current visual complaints - Underwent cataract surgery approximately 18 years ago - Had an eye check-up earlier this year - Seldom uses reading glasses, only for small print or when fatigued  Peripheral vascular and neurologic status of lower extremities - No cuts, sores, or wounds on feet  Psychological stress - Experiences anxiety related to his sister's health, who has been in a nursing home for 13 years due to multiple sclerosis - Recently visited his sister and observed her face was flushed, but she appeared to recognize him and Patrick Perry - Anticipates a family reunion at the nursing home where his sister resides      ROS    Objective:     BP (!) 129/58 (BP Location: Right Arm, Patient Position: Sitting, Cuff Size: Normal)   Pulse (!) 53   Temp 97.7 F (36.5 C) (Oral)   Ht 5' 9 (1.753 m)   Wt 152 lb 3.2 oz (69 kg)   SpO2 99%   BMI 22.48 kg/m    Physical Exam Vitals and nursing note reviewed.  Constitutional:      Appearance: Normal appearance.  HENT:     Head:  Normocephalic and atraumatic.  Eyes:     Conjunctiva/sclera: Conjunctivae normal.  Cardiovascular:     Rate and Rhythm: Normal rate and regular rhythm.  Pulmonary:     Effort: Pulmonary effort is normal.     Breath sounds: Normal breath sounds.  Skin:    General: Skin is warm and dry.  Neurological:     Mental Status: He is alert.  Psychiatric:        Mood and Affect: Mood normal.      No results found for any visits on 03/12/24.    The ASCVD Risk score (Arnett DK, et al., 2019) failed to calculate for the following reasons:   The 2019 ASCVD risk score is only valid for ages 56 to 9    Assessment & Plan:   Problem List Items Addressed This Visit       Cardiovascular and Mediastinum   Essential hypertension, benign - Primary   Relevant Orders   CBC w/Diff/Platelet   Lipid Panel With LDL/HDL Ratio   CMP14+EGFR     Other   Hyperlipidemia   Relevant Orders   CBC w/Diff/Platelet   Lipid Panel With LDL/HDL Ratio   CMP14+EGFR   Assessment and Plan Assessment & Plan Essential hypertension Blood pressure well-controlled with current medication. - Continue current antihypertensive medications. - Advise home blood pressure monitoring if unwell or elevated readings observed.  Pure hypercholesterolemia Cholesterol levels well-managed  with current treatment. - Continue current cholesterol management regimen. - due to recheck lipids  General Health Maintenance Discussed flu and COVID-19 vaccinations. Recommended due to age and risk category. - Administer flu shot today if agreed. - Offer COVID-19 vaccine, discuss and administer if agreed.   Return in about 6 months (around 09/09/2024) for Hypertension.    Dorothyann Byars, MD

## 2024-03-13 ENCOUNTER — Ambulatory Visit: Payer: Self-pay | Admitting: Family Medicine

## 2024-03-13 LAB — CBC WITH DIFFERENTIAL/PLATELET
Basophils Absolute: 0.1 x10E3/uL (ref 0.0–0.2)
Basos: 1 %
EOS (ABSOLUTE): 0.3 x10E3/uL (ref 0.0–0.4)
Eos: 4 %
Hematocrit: 43.4 % (ref 37.5–51.0)
Hemoglobin: 14.1 g/dL (ref 13.0–17.7)
Immature Grans (Abs): 0 x10E3/uL (ref 0.0–0.1)
Immature Granulocytes: 0 %
Lymphocytes Absolute: 2.1 x10E3/uL (ref 0.7–3.1)
Lymphs: 30 %
MCH: 31.6 pg (ref 26.6–33.0)
MCHC: 32.5 g/dL (ref 31.5–35.7)
MCV: 97 fL (ref 79–97)
Monocytes Absolute: 1 x10E3/uL — ABNORMAL HIGH (ref 0.1–0.9)
Monocytes: 13 %
Neutrophils Absolute: 3.8 x10E3/uL (ref 1.4–7.0)
Neutrophils: 52 %
Platelets: 209 x10E3/uL (ref 150–450)
RBC: 4.46 x10E6/uL (ref 4.14–5.80)
RDW: 13.6 % (ref 11.6–15.4)
WBC: 7.2 x10E3/uL (ref 3.4–10.8)

## 2024-03-13 LAB — CMP14+EGFR
ALT: 8 IU/L (ref 0–44)
AST: 24 IU/L (ref 0–40)
Albumin: 4.2 g/dL (ref 3.7–4.7)
Alkaline Phosphatase: 75 IU/L (ref 48–129)
BUN/Creatinine Ratio: 19 (ref 10–24)
BUN: 19 mg/dL (ref 8–27)
Bilirubin Total: 0.5 mg/dL (ref 0.0–1.2)
CO2: 23 mmol/L (ref 20–29)
Calcium: 9.4 mg/dL (ref 8.6–10.2)
Chloride: 104 mmol/L (ref 96–106)
Creatinine, Ser: 1 mg/dL (ref 0.76–1.27)
Globulin, Total: 2.8 g/dL (ref 1.5–4.5)
Glucose: 85 mg/dL (ref 70–99)
Potassium: 4.3 mmol/L (ref 3.5–5.2)
Sodium: 141 mmol/L (ref 134–144)
Total Protein: 7 g/dL (ref 6.0–8.5)
eGFR: 72 mL/min/1.73 (ref >59)

## 2024-03-13 LAB — LIPID PANEL WITH LDL/HDL RATIO
Cholesterol, Total: 154 mg/dL (ref 100–199)
HDL: 59 mg/dL (ref >39)
LDL Chol Calc (NIH): 80 mg/dL (ref 0–99)
LDL/HDL Ratio: 1.4 ratio (ref 0.0–3.6)
Triglycerides: 77 mg/dL (ref 0–149)
VLDL Cholesterol Cal: 15 mg/dL (ref 5–40)

## 2024-03-13 NOTE — Progress Notes (Signed)
 Hi Patrick Perry, blood count overall looks good.  Cholesterol looks better this year compared to last year so good work in bringing that down.  Metabolic panel including kidney and liver function look good

## 2024-03-19 ENCOUNTER — Ambulatory Visit (INDEPENDENT_AMBULATORY_CARE_PROVIDER_SITE_OTHER)

## 2024-03-19 VITALS — Temp 98.1°F

## 2024-03-19 DIAGNOSIS — Z23 Encounter for immunization: Secondary | ICD-10-CM | POA: Diagnosis not present

## 2024-03-19 NOTE — Progress Notes (Signed)
   Established Patient Office Visit  Subjective   Patient ID: Patrick Perry, male    DOB: Feb 02, 1933  Age: 89 y.o. MRN: [id number]  Chief Complaint  Patient presents with   Immunizations    Flu vaccine  nurse visit    HPI  High dose flu vaccine- nurse visit.  Patient denies any problems with previous vaccinations. Patient denies allergy to eggs or latex. Patient denies history of Guillan Barre syndrome . Patient  is feeling well today.   ROS    Objective:     Temp 98.1 F (36.7 C)    Physical Exam   No results found for any visits on 03/19/24.    The ASCVD Risk score (Arnett DK, et al., 2019) failed to calculate for the following reasons:   The 2019 ASCVD risk score is only valid for ages 44 to 52    Assessment & Plan:  Admin high dose flu vaccine IM left arm. Patient tolerated the injection well without complications.  Problem List Items Addressed This Visit   None Visit Diagnoses       Need for influenza vaccination    -  Primary   Relevant Orders   Flu vaccine HIGH DOSE PF(Fluzone Trivalent) (Completed)       Return for return as needed.    Suzen SHAUNNA Plenty, LPN

## 2024-03-19 NOTE — Patient Instructions (Signed)
 Return as needed

## 2024-07-16 ENCOUNTER — Telehealth: Payer: Self-pay

## 2024-07-16 NOTE — Telephone Encounter (Signed)
 Copied from CRM #[id number]. Topic: Clinical - Medication Refill >> Jul 15, 2024  4:26 PM Sophia H wrote: Medication: hydrochlorothiazide  (HYDRODIURIL ) 25 MG tablet   Has the patient contacted their pharmacy? Yes, pharmacy advised no fills on file    This is the patient's preferred pharmacy:  [facility] [phone] - [place], [place] - [address] [place] [address] Phone: [phone] Fax: [phone]  Is this the correct pharmacy for this prescription? Yes If no, delete pharmacy and type the correct one.   Has the prescription been filled recently? Ordered in August   Is the patient out of the medication? No, has a few days left   Has the patient been seen for an appointment in the last year OR does the patient have an upcoming appointment? Yes, appt on March 18  Can we respond through MyChart? Yes  Agent: Please be advised that Rx refills may take up to 3 business days. We ask that you follow-up with your pharmacy.

## 2024-07-24 ENCOUNTER — Other Ambulatory Visit: Payer: Self-pay

## 2024-07-24 DIAGNOSIS — I10 Essential (primary) hypertension: Secondary | ICD-10-CM

## 2024-07-24 MED ORDER — HYDROCHLOROTHIAZIDE 25 MG PO TABS: 12.5000 mg | ORAL_TABLET | Freq: Every day | ORAL | 1 refills | Status: AC

## 2024-09-09 ENCOUNTER — Ambulatory Visit: Admitting: Family Medicine
